# Patient Record
Sex: Female | Born: 1955 | Race: Black or African American | Hispanic: No | Marital: Single | Smoking: Current some day smoker
Health system: Southern US, Community
[De-identification: ages and names within clinical notes are randomized; demographics above are authoritative.]

## PROBLEM LIST (undated history)

## (undated) DIAGNOSIS — Z720 Tobacco use: Secondary | ICD-10-CM

## (undated) DIAGNOSIS — G4733 Obstructive sleep apnea (adult) (pediatric): Secondary | ICD-10-CM

## (undated) DIAGNOSIS — I10 Essential (primary) hypertension: Secondary | ICD-10-CM

## (undated) DIAGNOSIS — E669 Obesity, unspecified: Secondary | ICD-10-CM

## (undated) DIAGNOSIS — G4700 Insomnia, unspecified: Secondary | ICD-10-CM

## (undated) DIAGNOSIS — Q825 Congenital non-neoplastic nevus: Secondary | ICD-10-CM

## (undated) DIAGNOSIS — R011 Cardiac murmur, unspecified: Secondary | ICD-10-CM

## (undated) DIAGNOSIS — N6019 Diffuse cystic mastopathy of unspecified breast: Secondary | ICD-10-CM

## (undated) DIAGNOSIS — E7800 Pure hypercholesterolemia, unspecified: Secondary | ICD-10-CM

## (undated) HISTORY — DX: Congenital non-neoplastic nevus: Q82.5

## (undated) HISTORY — DX: Essential (primary) hypertension: I10

## (undated) HISTORY — DX: Diffuse cystic mastopathy of unspecified breast: N60.19

## (undated) HISTORY — DX: Obstructive sleep apnea (adult) (pediatric): G47.33

## (undated) HISTORY — DX: Tobacco use: Z72.0

## (undated) HISTORY — DX: Pure hypercholesterolemia, unspecified: E78.00

## (undated) HISTORY — DX: Insomnia, unspecified: G47.00

## (undated) HISTORY — DX: Obesity, unspecified: E66.9

## (undated) HISTORY — DX: Cardiac murmur, unspecified: R01.1

---

## 1982-11-18 HISTORY — PX: TUBAL LIGATION: SHX77

## 1998-07-18 ENCOUNTER — Encounter: Admission: RE | Admit: 1998-07-18 | Discharge: 1998-07-18 | Payer: Self-pay | Admitting: Internal Medicine

## 1998-08-15 ENCOUNTER — Encounter: Admission: RE | Admit: 1998-08-15 | Discharge: 1998-08-15 | Payer: Self-pay | Admitting: Internal Medicine

## 1999-02-21 ENCOUNTER — Encounter: Admission: RE | Admit: 1999-02-21 | Discharge: 1999-02-21 | Payer: Self-pay | Admitting: Hematology and Oncology

## 1999-12-03 ENCOUNTER — Encounter: Admission: RE | Admit: 1999-12-03 | Discharge: 1999-12-03 | Payer: Self-pay | Admitting: Internal Medicine

## 2000-03-17 ENCOUNTER — Encounter: Payer: Self-pay | Admitting: Emergency Medicine

## 2000-03-17 ENCOUNTER — Emergency Department (HOSPITAL_COMMUNITY): Admission: EM | Admit: 2000-03-17 | Discharge: 2000-03-17 | Payer: Self-pay | Admitting: *Deleted

## 2000-07-13 ENCOUNTER — Emergency Department (HOSPITAL_COMMUNITY): Admission: EM | Admit: 2000-07-13 | Discharge: 2000-07-13 | Payer: Self-pay | Admitting: Emergency Medicine

## 2000-11-05 ENCOUNTER — Encounter: Admission: RE | Admit: 2000-11-05 | Discharge: 2000-11-05 | Payer: Self-pay

## 2001-07-28 ENCOUNTER — Emergency Department (HOSPITAL_COMMUNITY): Admission: EM | Admit: 2001-07-28 | Discharge: 2001-07-28 | Payer: Self-pay | Admitting: Emergency Medicine

## 2001-12-09 ENCOUNTER — Encounter: Admission: RE | Admit: 2001-12-09 | Discharge: 2001-12-09 | Payer: Self-pay | Admitting: Internal Medicine

## 2001-12-28 ENCOUNTER — Ambulatory Visit (HOSPITAL_BASED_OUTPATIENT_CLINIC_OR_DEPARTMENT_OTHER): Admission: RE | Admit: 2001-12-28 | Discharge: 2001-12-28 | Payer: Self-pay | Admitting: *Deleted

## 2002-04-15 ENCOUNTER — Encounter: Admission: RE | Admit: 2002-04-15 | Discharge: 2002-04-15 | Payer: Self-pay | Admitting: Internal Medicine

## 2002-04-21 ENCOUNTER — Encounter: Admission: RE | Admit: 2002-04-21 | Discharge: 2002-04-21 | Payer: Self-pay | Admitting: Internal Medicine

## 2002-09-14 ENCOUNTER — Emergency Department (HOSPITAL_COMMUNITY): Admission: EM | Admit: 2002-09-14 | Discharge: 2002-09-14 | Payer: Self-pay | Admitting: Emergency Medicine

## 2002-09-17 ENCOUNTER — Emergency Department (HOSPITAL_COMMUNITY): Admission: EM | Admit: 2002-09-17 | Discharge: 2002-09-17 | Payer: Self-pay | Admitting: Emergency Medicine

## 2002-11-03 ENCOUNTER — Emergency Department (HOSPITAL_COMMUNITY): Admission: EM | Admit: 2002-11-03 | Discharge: 2002-11-03 | Payer: Self-pay | Admitting: Emergency Medicine

## 2003-02-14 ENCOUNTER — Encounter: Admission: RE | Admit: 2003-02-14 | Discharge: 2003-02-14 | Payer: Self-pay | Admitting: Internal Medicine

## 2003-05-25 ENCOUNTER — Emergency Department (HOSPITAL_COMMUNITY): Admission: EM | Admit: 2003-05-25 | Discharge: 2003-05-25 | Payer: Self-pay | Admitting: Emergency Medicine

## 2003-05-25 ENCOUNTER — Encounter: Payer: Self-pay | Admitting: Emergency Medicine

## 2003-05-30 ENCOUNTER — Emergency Department (HOSPITAL_COMMUNITY): Admission: EM | Admit: 2003-05-30 | Discharge: 2003-05-30 | Payer: Self-pay | Admitting: Emergency Medicine

## 2003-06-07 ENCOUNTER — Emergency Department (HOSPITAL_COMMUNITY): Admission: EM | Admit: 2003-06-07 | Discharge: 2003-06-07 | Payer: Self-pay | Admitting: Emergency Medicine

## 2003-06-10 ENCOUNTER — Emergency Department (HOSPITAL_COMMUNITY): Admission: EM | Admit: 2003-06-10 | Discharge: 2003-06-10 | Payer: Self-pay

## 2004-04-13 LAB — HM MAMMOGRAPHY: HM Mammogram: NORMAL

## 2004-04-18 ENCOUNTER — Encounter: Admission: RE | Admit: 2004-04-18 | Discharge: 2004-04-18 | Payer: Self-pay | Admitting: Internal Medicine

## 2004-05-14 ENCOUNTER — Encounter (INDEPENDENT_AMBULATORY_CARE_PROVIDER_SITE_OTHER): Payer: Self-pay | Admitting: *Deleted

## 2004-05-14 ENCOUNTER — Encounter: Admission: RE | Admit: 2004-05-14 | Discharge: 2004-05-14 | Payer: Self-pay | Admitting: Internal Medicine

## 2004-07-04 ENCOUNTER — Emergency Department (HOSPITAL_COMMUNITY): Admission: EM | Admit: 2004-07-04 | Discharge: 2004-07-04 | Payer: Self-pay | Admitting: Emergency Medicine

## 2004-08-10 ENCOUNTER — Emergency Department (HOSPITAL_COMMUNITY): Admission: EM | Admit: 2004-08-10 | Discharge: 2004-08-10 | Payer: Self-pay | Admitting: Emergency Medicine

## 2005-01-18 ENCOUNTER — Encounter (INDEPENDENT_AMBULATORY_CARE_PROVIDER_SITE_OTHER): Payer: Self-pay | Admitting: *Deleted

## 2005-01-18 LAB — CONVERTED CEMR LAB
Cholesterol: 207 mg/dL
HDL: 95 mg/dL
Triglycerides: 62 mg/dL

## 2005-02-18 ENCOUNTER — Ambulatory Visit: Payer: Self-pay | Admitting: Internal Medicine

## 2005-02-19 ENCOUNTER — Ambulatory Visit: Payer: Self-pay | Admitting: Internal Medicine

## 2006-04-02 ENCOUNTER — Ambulatory Visit: Payer: Self-pay | Admitting: Internal Medicine

## 2006-11-06 ENCOUNTER — Encounter (INDEPENDENT_AMBULATORY_CARE_PROVIDER_SITE_OTHER): Payer: Self-pay | Admitting: *Deleted

## 2006-11-06 DIAGNOSIS — G4730 Sleep apnea, unspecified: Secondary | ICD-10-CM | POA: Insufficient documentation

## 2006-11-06 DIAGNOSIS — G4700 Insomnia, unspecified: Secondary | ICD-10-CM | POA: Insufficient documentation

## 2006-11-06 DIAGNOSIS — R011 Cardiac murmur, unspecified: Secondary | ICD-10-CM

## 2006-11-06 DIAGNOSIS — F17200 Nicotine dependence, unspecified, uncomplicated: Secondary | ICD-10-CM | POA: Insufficient documentation

## 2006-11-06 DIAGNOSIS — N6019 Diffuse cystic mastopathy of unspecified breast: Secondary | ICD-10-CM

## 2006-11-06 DIAGNOSIS — E7800 Pure hypercholesterolemia, unspecified: Secondary | ICD-10-CM

## 2006-11-06 DIAGNOSIS — I10 Essential (primary) hypertension: Secondary | ICD-10-CM | POA: Insufficient documentation

## 2006-11-06 DIAGNOSIS — E669 Obesity, unspecified: Secondary | ICD-10-CM

## 2007-08-20 ENCOUNTER — Encounter (INDEPENDENT_AMBULATORY_CARE_PROVIDER_SITE_OTHER): Payer: Self-pay | Admitting: Infectious Diseases

## 2007-08-20 ENCOUNTER — Ambulatory Visit: Payer: Self-pay | Admitting: Internal Medicine

## 2007-08-20 LAB — CONVERTED CEMR LAB
BUN: 16 mg/dL (ref 6–23)
Calcium: 9.2 mg/dL (ref 8.4–10.5)
Cholesterol: 189 mg/dL (ref 0–200)
Glucose, Bld: 81 mg/dL (ref 70–99)
Triglycerides: 68 mg/dL (ref <150)

## 2008-09-27 ENCOUNTER — Ambulatory Visit: Payer: Self-pay | Admitting: Internal Medicine

## 2008-09-27 ENCOUNTER — Encounter (INDEPENDENT_AMBULATORY_CARE_PROVIDER_SITE_OTHER): Payer: Self-pay | Admitting: Internal Medicine

## 2008-09-27 DIAGNOSIS — G479 Sleep disorder, unspecified: Secondary | ICD-10-CM | POA: Insufficient documentation

## 2008-09-27 LAB — CONVERTED CEMR LAB
BUN: 11 mg/dL (ref 6–23)
Basophils Relative: 0 % (ref 0–1)
Calcium: 9.5 mg/dL (ref 8.4–10.5)
Chloride: 103 meq/L (ref 96–112)
Creatinine, Ser: 0.78 mg/dL (ref 0.40–1.20)
Eosinophils Absolute: 0.2 10*3/uL (ref 0.0–0.7)
Eosinophils Relative: 3 % (ref 0–5)
HCT: 38.2 % (ref 36.0–46.0)
Hemoglobin: 12.9 g/dL (ref 12.0–15.0)
MCHC: 33.8 g/dL (ref 30.0–36.0)
MCV: 90.7 fL (ref 78.0–100.0)
Monocytes Absolute: 0.4 10*3/uL (ref 0.1–1.0)
Monocytes Relative: 7 % (ref 3–12)
Neutro Abs: 2.5 10*3/uL (ref 1.7–7.7)
RBC: 4.21 M/uL (ref 3.87–5.11)
RDW: 13.5 % (ref 11.5–15.5)

## 2008-10-25 ENCOUNTER — Encounter: Payer: Self-pay | Admitting: Internal Medicine

## 2009-10-04 ENCOUNTER — Telehealth: Payer: Self-pay | Admitting: *Deleted

## 2009-10-19 ENCOUNTER — Telehealth: Payer: Self-pay | Admitting: *Deleted

## 2009-12-12 ENCOUNTER — Ambulatory Visit: Payer: Self-pay | Admitting: Internal Medicine

## 2009-12-12 LAB — CONVERTED CEMR LAB
AST: 24 units/L (ref 0–37)
Alkaline Phosphatase: 83 units/L (ref 39–117)
Glucose, Bld: 104 mg/dL — ABNORMAL HIGH (ref 70–99)
HDL: 76 mg/dL (ref >39)
LDL Cholesterol: 119 mg/dL — ABNORMAL HIGH (ref 0–99)
Sodium: 139 meq/L (ref 135–145)
Total Bilirubin: 0.6 mg/dL (ref 0.3–1.2)
Total Protein: 8.3 g/dL (ref 6.0–8.3)
Triglycerides: 68 mg/dL (ref <150)
VLDL: 14 mg/dL (ref 0–40)

## 2010-12-09 ENCOUNTER — Encounter: Payer: Self-pay | Admitting: Internal Medicine

## 2010-12-09 ENCOUNTER — Encounter: Payer: Self-pay | Admitting: Infectious Diseases

## 2010-12-18 NOTE — Assessment & Plan Note (Signed)
Summary: ACUTE-BAD COLD/CFB(SILWAL)   Vital Signs:  Patient profile:   55 year old female Height:      61 inches (154.94 cm) Weight:      200.1 pounds (90.95 kg) BMI:     37.95 Temp:     97.1 degrees F (36.17 degrees C) oral Pulse rate:   72 / minute BP sitting:   173 / 99  (right arm)  Vitals Entered By: Stanton Kidney Ditzler RN (December 12, 2009 8:52 AM) Is Patient Diabetic? No Pain Assessment Patient in pain? no      Nutritional Status BMI of > 30 = obese Nutritional Status Detail appetite good  Have you ever been in a relationship where you felt threatened, hurt or afraid?denies   Does patient need assistance? Functional Status Self care Ambulation Normal Comments Ck-up and refills on meds. Problems sleeping - ? stress of depression.   History of Present Illness: This is a 55 year old woman with past medical history outlined in chart.  She is here for a check up (does not have a cold as title of this document implies).    Her main complaint is that she is having trouble sleeping.  Sleeps for 3-4 hours at night.  Not sure why she wakes up, is not out of breath, does not snore, her mind is not racing, she is not uncomfortable.  Does not sleep during the day, but is always tired.  When she wakes up at night she eats, thinks this is stress eating she is not actually hungry.  Does not drink caffiene, does smoke about 1/2ppd, no ETOH.  Does not exercise.    Does have significant life stress.    Depression History:      The patient denies a depressed mood most of the day and a diminished interest in her usual daily activities.         Preventive Screening-Counseling & Management  Alcohol-Tobacco     Smoking Status: current     Smoking Cessation Counseling: yes     Packs/Day: 1/2     Year Started: AT THE AGE OF 18  Caffeine-Diet-Exercise     Does Patient Exercise: yes     Type of exercise: WALKING     Times/week: 5  Current Medications (verified): 1)  Hydrochlorothiazide 25  Mg Tabs (Hydrochlorothiazide) .... Take 1 Tablet By Mouth Once A Day 2)  Lisinopril 20 Mg Tabs (Lisinopril) .... Take 1 Tablet By Mouth Once A Day  Allergies (verified): No Known Drug Allergies  Social History: last 2 brothers and a fiance in 2009-2010 takes care of mohter and father who are both disabled.  lives with her mother. 1/2ppd smoker occasional ETOH no drug use.  Review of Systems       per hpi  Physical Exam  General:  alert and overweight-appearing.   Head:  normocephalic and atraumatic.   Eyes:  vision grossly intact, pupils equal, pupils round, and pupils reactive to light.   Mouth:  pharynx pink and moist and poor dentition.   Lungs:  normal respiratory effort and normal breath sounds.   Heart:  normal rate, regular rhythm, and Grade   3/6 systolic ejection murmur.   Extremities:  no edema Neurologic:  alert & oriented X3, cranial nerves II-XII intact, strength normal in all extremities, and gait normal.   Psych:  Oriented X3, memory intact for recent and remote, normally interactive, good eye contact, tearful, and slightly anxious.     Impression & Recommendations:  Problem #  1:  INSOMNIA (ICD-780.52) Is not taking abien that had previously been prescribed.  Does not want a prescription sleep aid.  Does not want medication for depression. Has had a sleep study in 2007, no CPAP recomended at that time.  Denies morning headache, snoring, night time gasping. Discussed sleep hygine, exercise, benadryl.  sleep hygeine hand out.  The following medications were removed from the medication list:    Ambien Cr 6.25 Mg Tbcr (Zolpidem tartrate) .Marland Kitchen... Take 1 tab by mouth at bedtime as needed  Problem # 2:  HYPERTENSION (ICD-401.9) takes meds with no missed doses.  BP stil way above goal, could be due to stress and insomnia, but will treat. will start norvasc 5 rtc in 2 weeks for bp check up.   Her updated medication list for this problem includes:     Hydrochlorothiazide 25 Mg Tabs (Hydrochlorothiazide) .Marland Kitchen... Take 1 tablet by mouth once a day    Lisinopril 20 Mg Tabs (Lisinopril) .Marland Kitchen... Take 1 tablet by mouth once a day    Amlodipine Besylate 5 Mg Tabs (Amlodipine besylate) .Marland Kitchen... Take one tablet daily for blood pressure.  Problem # 3:  HYPERCHOLESTEROLEMIA (ICD-272.0) lipids and cmet today. last checked in 2008.  Orders: T-Lipid Profile ([id number])  Problem # 4:  TOBACCO ABUSE (ICD-305.1) stil smoking 1/22pd.  trying to cut down. contemplation phase.  Problem # 5:  OBESITY (ICD-278.00) discussed diet and exercise.  will check a1c. insomnia contributes.  Orders: T-Hgb A1C (in-house) ([id number])  Complete Medication List: 1)  Hydrochlorothiazide 25 Mg Tabs (Hydrochlorothiazide) .... Take 1 tablet by mouth once a day 2)  Lisinopril 20 Mg Tabs (Lisinopril) .... Take 1 tablet by mouth once a day 3)  Amlodipine Besylate 5 Mg Tabs (Amlodipine besylate) .... Take one tablet daily for blood pressure.  Other Orders: T-Comprehensive Metabolic Panel ([id number]) T-Hemoccult Card-Multiple (take home) ([id number]) Mammogram (Screening) (Mammo)  Patient Instructions: 1)  You have a new medication for blood pressure. 2)  Please schedule a follow-up appointment in 2 weeks for blood pressure recheck. 3)  You have a handout on better sleep habits, use these suggestions to help you sleep.  Try to exercise daily this will help as well.  You can also try benadryl 12.5 mg at bedtime to help with sleep. 4)  You had lab work done today, we will call you if there is anything that needs to be addressed before your next appointment. Prescriptions: LISINOPRIL 20 MG TABS (LISINOPRIL) Take 1 tablet by mouth once a day  #31 x 6   Entered and Authorized by:   Elby Showers MD   Signed by:   Elby Showers MD on 12/12/2009   Method used:   Electronically to        [facility] (retail)       [address]       [place], [place]  [address]       Ph: [phone]       Fax: [phone]   RxID:   [id number] HYDROCHLOROTHIAZIDE 25 MG TABS (HYDROCHLOROTHIAZIDE) Take 1 tablet by mouth once a day  #31 x 6   Entered and Authorized by:   Elby Showers MD   Signed by:   Elby Showers MD on 12/12/2009   Method used:   Electronically to        [facility] (retail)       [address]  [place], [place]  [address]       Ph: [phone]       Fax: [phone]   RxID:   [id number] AMLODIPINE BESYLATE 5 MG TABS (AMLODIPINE BESYLATE) Take one tablet daily for blood pressure.  #32 x 3   Entered and Authorized by:   Elby Showers MD   Signed by:   Elby Showers MD on 12/12/2009   Method used:   Electronically to        [facility] (retail)       [address]       [place], [place]  [address]       Ph: [phone]       Fax: [phone]   RxID:   [id number]  Process Orders Check Orders Results:     [facility]: Order checked:     Elby Showers MD NOT AUTHORIZED TO ORDER Tests Sent for requisitioning (December 12, 2009 9:52 AM):     12/12/2009: [facility] -- T-Lipid Profile [id number] (signed)     12/12/2009: [facility] -- T-Comprehensive Metabolic Panel [phone] [phone] (signed)    Prevention & Chronic Care Immunizations   Influenza vaccine: Not documented    Tetanus booster: Not documented    Pneumococcal vaccine: Not documented  Colorectal Screening   Hemoccult: Not documented   Hemoccult action/deferral: Ordered  (12/12/2009)    Colonoscopy: Not documented  Other Screening   Pap smear: Not documented   Pap smear due: 12/12/2010    Mammogram: Normal  (04/13/2004)   Mammogram action/deferral: Ordered  (12/12/2009)   Smoking status: current  (12/12/2009)   Smoking cessation counseling: yes  (12/12/2009)  Lipids   Total Cholesterol: 189   (08/20/2007)   Lipid panel action/deferral: Lipid Panel ordered   LDL: 91  (08/20/2007)   LDL Direct: Not documented   HDL: 84  (08/20/2007)   Triglycerides: 68  (08/20/2007)    SGOT (AST): Not documented   BMP action: Ordered   SGPT (ALT): Not documented CMP ordered    Alkaline phosphatase: Not documented   Total bilirubin: Not documented    Lipid flowsheet reviewed?: Yes   Progress toward LDL goal: Unchanged  Hypertension   Last Blood Pressure: 173 / 99  (12/12/2009)   Serum creatinine: 0.78  (09/27/2008)   BMP action: Ordered   Serum potassium 3.8  (09/27/2008) CMP ordered     Hypertension flowsheet reviewed?: Yes   Progress toward BP goal: Unchanged  Self-Management Support :    Patient will work on the following items until the next clinic visit to reach self-care goals:     Medications and monitoring: take my medicines every day, check my blood pressure, weigh myself weekly  (12/12/2009)     Eating: eat more vegetables, use fresh or frozen vegetables, eat fruit for snacks and desserts  (12/12/2009)     Activity: take a 30 minute walk every day, take the stairs instead of the elevator, park at the far end of the parking lot  (12/12/2009)    Hypertension self-management support: Not documented    Lipid self-management support: Not documented    Nursing Instructions: Provide Hemoccult cards with instructions (see order) Schedule screening mammogram (see order)   Laboratory Results   Blood Tests   Date/Time Received: December 12, 2009 9:44 AM  Date/Time Reported: Burke Keels  December 12, 2009 9:44 AM   HGBA1C: 5.9%   (Normal  Range: Non-Diabetic - 3-6%   Control Diabetic - 6-8%)

## 2011-01-25 ENCOUNTER — Other Ambulatory Visit: Payer: Self-pay | Admitting: *Deleted

## 2011-01-25 MED ORDER — LISINOPRIL 20 MG PO TABS: 20.0000 mg | ORAL_TABLET | Freq: Every day | ORAL | Status: DC

## 2011-01-25 MED ORDER — HYDROCHLOROTHIAZIDE 25 MG PO TABS: 25.0000 mg | ORAL_TABLET | Freq: Every day | ORAL | Status: DC

## 2011-01-25 MED ORDER — AMLODIPINE BESYLATE 5 MG PO TABS: 5.0000 mg | ORAL_TABLET | Freq: Every day | ORAL | Status: DC

## 2011-01-25 NOTE — Telephone Encounter (Signed)
Pt requests enough refill to last until her appt 02/13/11 w/Dr Allena Katz.

## 2011-01-30 ENCOUNTER — Encounter: Payer: Self-pay | Admitting: Internal Medicine

## 2011-02-13 ENCOUNTER — Encounter: Payer: Self-pay | Admitting: Internal Medicine

## 2011-04-15 ENCOUNTER — Encounter: Payer: Self-pay | Admitting: Internal Medicine

## 2011-05-01 ENCOUNTER — Encounter: Payer: Self-pay | Admitting: Internal Medicine

## 2011-06-26 ENCOUNTER — Ambulatory Visit (INDEPENDENT_AMBULATORY_CARE_PROVIDER_SITE_OTHER): Payer: Self-pay | Admitting: Internal Medicine

## 2011-06-26 ENCOUNTER — Encounter: Payer: Self-pay | Admitting: Internal Medicine

## 2011-06-26 VITALS — BP 200/98 | HR 63 | Temp 97.3°F | Ht 61.0 in | Wt 198.6 lb

## 2011-06-26 DIAGNOSIS — I10 Essential (primary) hypertension: Secondary | ICD-10-CM

## 2011-06-26 MED ORDER — METOPROLOL TARTRATE 25 MG PO TABS: 25.0000 mg | ORAL_TABLET | Freq: Two times a day (BID) | ORAL | Status: DC

## 2011-06-26 MED ORDER — LISINOPRIL 40 MG PO TABS: 40.0000 mg | ORAL_TABLET | Freq: Every day | ORAL | Status: DC

## 2011-06-26 MED ORDER — HYDROCHLOROTHIAZIDE 25 MG PO TABS
25.0000 mg | ORAL_TABLET | Freq: Every day | ORAL | Status: DC
Start: 2011-06-26 — End: 2012-07-03

## 2011-06-26 NOTE — Progress Notes (Signed)
  Subjective:    Patient ID: Selena Mclean, female    DOB: [birth year], 55 y.o.   MRN: [id number]  HPI Selena Mclean is a pleasant 55 year old woman with past medical history of hypertension who comes in the clinic for regular followup visit. Her blood pressure today is 200/98. But she denies any symptoms namely chest pain, shortness of breath, headache, vision changes. She feels all right and at her baseline.  Denies any fever, chills, nausea, vomiting, abdominal pain, urinary abnormalities. She is pretty active and exercises (walks) almost every day.    Review of Systems    as per history of present illness, all other systems reviewed and negative. Objective:   Physical Exam  Constitutional: Vital signs reviewed.  Patient is a well-developed and well-nourished in no acute distress and cooperative with exam. Alert and oriented x3.  Head: Normocephalic and atraumatic Mouth: no erythema or exudates, MMM Eyes: PERRL, EOMI, conjunctivae normal, No scleral icterus.  Neck: Supple, Trachea midline normal ROM, No JVD, mass, thyromegaly, or carotid bruit present.  Cardiovascular: RRR, S1 normal, S2 normal, systolic murmur, pulses symmetric and intact bilaterally. Pulmonary/Chest: CTAB, no wheezes, rales, or rhonchi Abdominal: Soft. Non-tender, non-distended, bowel sounds are normal, no masses, organomegaly, or guarding present.  GU: no CVA tenderness Musculoskeletal: No joint deformities, erythema, or stiffness, ROM full and no nontender Neurological: A&O x3, Strenght is normal and symmetric bilaterally, cranial nerve II-XII are grossly intact, no focal motor deficit, sensory intact to light touch bilaterally.  Psychiatric: Normal mood and affect. speech and behavior is normal. Judgment and thought content normal. Cognition and memory are normal.         Assessment & Plan:

## 2011-06-26 NOTE — Patient Instructions (Signed)
Please make an follow up appointment in 5-6 weeks with Dr. Allena Katz. Please start Lisinopril at 40 mg daily instead of 20 mg daily. Also will add one new Blood pressure pill namely Metoprolol 25 mg twice a day. Please continue exercise.

## 2011-06-26 NOTE — Assessment & Plan Note (Signed)
Blood pressure today is 200/98. She says she took her blood pressure pills this morning but skips them once in a while. She did have her blood pressure checked at [facility] recently and it was high too. I will increase the dose of lisinopril to 40 mg daily and add metoprolol at 25 mg by mouth twice a day. I will continue HCTZ at 25 mg daily. Will check a BMP today. I will see her back in about 5-6 weeks to check her blood pressure and do appropriate changes in medications as needed.

## 2011-06-27 LAB — BASIC METABOLIC PANEL WITH GFR
BUN: 16 mg/dL (ref 6–23)
Chloride: 103 mEq/L (ref 96–112)
GFR, Est Non African American: >60 mL/min (ref >60)
Potassium: 3.6 mEq/L (ref 3.5–5.3)

## 2012-07-01 ENCOUNTER — Encounter: Payer: Self-pay | Admitting: Internal Medicine

## 2012-07-03 ENCOUNTER — Emergency Department (HOSPITAL_COMMUNITY)
Admission: EM | Admit: 2012-07-03 | Discharge: 2012-07-04 | Disposition: A | Discharge status: Home / Self Care | Payer: Self-pay | Attending: Emergency Medicine | Admitting: Emergency Medicine

## 2012-07-03 ENCOUNTER — Encounter (HOSPITAL_COMMUNITY): Payer: Self-pay | Admitting: Emergency Medicine

## 2012-07-03 DIAGNOSIS — F17200 Nicotine dependence, unspecified, uncomplicated: Secondary | ICD-10-CM | POA: Insufficient documentation

## 2012-07-03 DIAGNOSIS — E7800 Pure hypercholesterolemia, unspecified: Secondary | ICD-10-CM | POA: Insufficient documentation

## 2012-07-03 DIAGNOSIS — I10 Essential (primary) hypertension: Secondary | ICD-10-CM | POA: Insufficient documentation

## 2012-07-03 DIAGNOSIS — R51 Headache: Secondary | ICD-10-CM | POA: Insufficient documentation

## 2012-07-03 DIAGNOSIS — Z79899 Other long term (current) drug therapy: Secondary | ICD-10-CM | POA: Insufficient documentation

## 2012-07-03 NOTE — ED Notes (Signed)
GNF:[id number]<YQ> Expected date:07/03/12<BR> Expected time:10:07 PM<BR> Means of arrival:Ambulance<BR> Comments:<BR> RM: 6, PTAR 32, headache x 5 hr

## 2012-07-03 NOTE — ED Notes (Addendum)
Report given via PTAR. Pt c/o headache for 4-5 hrs. Pt is compliant with medications, but has been out of medications for 3 days. Pt takes lisinopril, HCTZ, and metoprololol. Initial VS at 2207 160/86 HR 92 RR 16 O2 RA 95%. Temporal headache 7/10.

## 2012-07-04 ENCOUNTER — Emergency Department (HOSPITAL_COMMUNITY)
Admission: EM | Admit: 2012-07-04 | Discharge: 2012-07-06 | Disposition: A | Discharge status: Home / Self Care | Payer: Self-pay | Attending: Emergency Medicine | Admitting: Emergency Medicine

## 2012-07-04 ENCOUNTER — Encounter (HOSPITAL_COMMUNITY): Payer: Self-pay | Admitting: Emergency Medicine

## 2012-07-04 DIAGNOSIS — G4733 Obstructive sleep apnea (adult) (pediatric): Secondary | ICD-10-CM | POA: Insufficient documentation

## 2012-07-04 DIAGNOSIS — F329 Major depressive disorder, single episode, unspecified: Secondary | ICD-10-CM | POA: Insufficient documentation

## 2012-07-04 DIAGNOSIS — E7800 Pure hypercholesterolemia, unspecified: Secondary | ICD-10-CM | POA: Insufficient documentation

## 2012-07-04 DIAGNOSIS — Z79899 Other long term (current) drug therapy: Secondary | ICD-10-CM | POA: Insufficient documentation

## 2012-07-04 DIAGNOSIS — F17200 Nicotine dependence, unspecified, uncomplicated: Secondary | ICD-10-CM | POA: Insufficient documentation

## 2012-07-04 DIAGNOSIS — I10 Essential (primary) hypertension: Secondary | ICD-10-CM | POA: Insufficient documentation

## 2012-07-04 DIAGNOSIS — F3289 Other specified depressive episodes: Secondary | ICD-10-CM | POA: Insufficient documentation

## 2012-07-04 LAB — CBC WITH DIFFERENTIAL/PLATELET
Basophils Absolute: 0 10*3/uL (ref 0.0–0.1)
Basophils Relative: 0 % (ref 0–1)
MCHC: 34.6 g/dL (ref 30.0–36.0)
Monocytes Absolute: 0.7 10*3/uL (ref 0.1–1.0)
Neutro Abs: 6.9 10*3/uL (ref 1.7–7.7)
Neutrophils Relative %: 62 % (ref 43–77)
Platelets: 302 10*3/uL (ref 150–400)
RDW: 13.4 % (ref 11.5–15.5)

## 2012-07-04 LAB — ACETAMINOPHEN LEVEL: Acetaminophen (Tylenol), Serum: <15 ug/mL (ref 10–30)

## 2012-07-04 LAB — COMPREHENSIVE METABOLIC PANEL
AST: 31 U/L (ref 0–37)
Albumin: 3.9 g/dL (ref 3.5–5.2)
Chloride: 97 mEq/L (ref 96–112)
Creatinine, Ser: 0.91 mg/dL (ref 0.50–1.10)
Potassium: 4.2 mEq/L (ref 3.5–5.1)
Sodium: 133 mEq/L — ABNORMAL LOW (ref 135–145)
Total Bilirubin: 0.4 mg/dL (ref 0.3–1.2)

## 2012-07-04 MED ORDER — ACETAMINOPHEN 325 MG PO TABS: 650.0000 mg | ORAL_TABLET | ORAL | Status: DC | PRN

## 2012-07-04 MED ORDER — LISINOPRIL 40 MG PO TABS: 40.0000 mg | ORAL_TABLET | Freq: Every day | ORAL | Status: DC

## 2012-07-04 MED ORDER — NICOTINE 21 MG/24HR TD PT24
21.0000 mg | MEDICATED_PATCH | Freq: Every day | TRANSDERMAL | Status: DC
  Filled 2012-07-04: qty 1

## 2012-07-04 MED ORDER — ALUM & MAG HYDROXIDE-SIMETH 200-200-20 MG/5ML PO SUSP: 30.0000 mL | ORAL | Status: DC | PRN

## 2012-07-04 MED ORDER — METOPROLOL TARTRATE 25 MG PO TABS: 25.0000 mg | ORAL_TABLET | Freq: Two times a day (BID) | ORAL | Status: DC

## 2012-07-04 MED ORDER — ZOLPIDEM TARTRATE 10 MG PO TABS
10.0000 mg | ORAL_TABLET | Freq: Every evening | ORAL | Status: DC | PRN
  Filled 2012-07-04: qty 1

## 2012-07-04 MED ORDER — LISINOPRIL 40 MG PO TABS
40.0000 mg | ORAL_TABLET | Freq: Every day | ORAL | Status: DC
  Administered 2012-07-04 – 2012-07-05 (×2): 40 mg via ORAL
  Filled 2012-07-04 (×3): qty 1

## 2012-07-04 MED ORDER — ONDANSETRON HCL 4 MG PO TABS: 4.0000 mg | ORAL_TABLET | Freq: Three times a day (TID) | ORAL | Status: DC | PRN

## 2012-07-04 MED ORDER — LORAZEPAM 1 MG PO TABS
1.0000 mg | ORAL_TABLET | Freq: Three times a day (TID) | ORAL | Status: DC | PRN
  Filled 2012-07-04: qty 1

## 2012-07-04 MED ORDER — IBUPROFEN 600 MG PO TABS
600.0000 mg | ORAL_TABLET | Freq: Three times a day (TID) | ORAL | Status: DC | PRN
  Filled 2012-07-04: qty 1

## 2012-07-04 MED ORDER — HYDROCHLOROTHIAZIDE 25 MG PO TABS
25.0000 mg | ORAL_TABLET | Freq: Every day | ORAL | Status: DC
  Administered 2012-07-04 – 2012-07-05 (×2): 25 mg via ORAL
  Filled 2012-07-04 (×3): qty 1

## 2012-07-04 MED ORDER — TRAZODONE HCL 100 MG PO TABS
100.0000 mg | ORAL_TABLET | Freq: Every day | ORAL | Status: DC
  Administered 2012-07-05: 100 mg via ORAL
  Filled 2012-07-04 (×2): qty 1

## 2012-07-04 MED ORDER — HYDROCHLOROTHIAZIDE 25 MG PO TABS: 25.0000 mg | ORAL_TABLET | Freq: Every day | ORAL | Status: DC

## 2012-07-04 MED ORDER — METOPROLOL TARTRATE 25 MG PO TABS
25.0000 mg | ORAL_TABLET | Freq: Two times a day (BID) | ORAL | Status: DC
  Administered 2012-07-04 – 2012-07-05 (×4): 25 mg via ORAL
  Filled 2012-07-04 (×4): qty 1

## 2012-07-04 MED ORDER — KETOROLAC TROMETHAMINE 60 MG/2ML IM SOLN
60.0000 mg | Freq: Once | INTRAMUSCULAR | Status: AC
  Administered 2012-07-04: 60 mg via INTRAMUSCULAR
  Filled 2012-07-04: qty 2

## 2012-07-04 MED ORDER — CITALOPRAM HYDROBROMIDE 40 MG PO TABS
40.0000 mg | ORAL_TABLET | Freq: Every day | ORAL | Status: DC
  Administered 2012-07-05 (×2): 40 mg via ORAL
  Filled 2012-07-04 (×4): qty 1

## 2012-07-04 NOTE — ED Notes (Signed)
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 Pt said she lived with her mom all her life. Pt tearful at times. When asked about suicidality pt says "I think if I wasn't here it would be a lot easier".

## 2012-07-04 NOTE — BHH Counselor (Signed)
Will wait to assess after Tele Psych since there may be a chance pt is discharged with follow up.  However, if pt needs placement, ACT will assess and find placement appropriate for pt.  Currently no beds available.

## 2012-07-04 NOTE — ED Notes (Signed)
Pt skin warm and dry. Patient in NAD. Respirations even, unlabored, even chest rise and fall. Will assess patient and medicate with due medications upon waking.

## 2012-07-04 NOTE — ED Provider Notes (Signed)
History     CSN: [id number]  Arrival date & time 07/04/12  1222   First MD Initiated Contact with Patient 07/04/12 1247      Chief Complaint  Patient presents with  . V70.1    (Consider location/radiation/quality/duration/timing/severity/associated sxs/prior treatment) HPI  About a year and a half ago she lost her brother, a friend, and her father with in about 6 months of each other. 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 She states she's never lived on her own. Her mother died this week and her service was yesterday. Patient states she has absolutely no family, she has no husband or children. She states she doesn't know what to do, states "I need some help". She states she has been treated for depression a year and a half ago when she had been 3 deaths close together and was treated with Celexa and trazodone however she started feeling better and she quit taking them.She reports her mother started getting in poor health in April and she started going back to [facility] for depression and she was restarted on her Celexa and trazodone in July. However she relates she still is severely depressed. She states "if I wasn't here I wouldn't have to deal with all of his" when asked if she had thoughts about hurting herself, then starts crying.   Pt seen in the ED yesterday for headaches and being out of her BP medications. Did relate she was depressed at that time.   PCP [facility]  Past Medical History  Diagnosis Date  . Hypertension   . Birthmarks, pigmented     right arm since birth  . Obesity   . Hypercholesterolemia     hypercholesterolemia- diet [place]  . Fibrocystic breast   . Tobacco abuse     1/2 packper day  . Obstructive sleep apnea     mild, no ask recommeded  . Systolic murmur     innocent per pt., evaluateed years ago  . Insomnia     Past Surgical History  Procedure Date  . Tubal ligation 1984    No family  history on file.  History  Substance Use Topics  . Smoking status: Current Some Day Smoker -- 0.5 packs/day    Types: Cigarettes  . Smokeless tobacco: Not on file  . Alcohol Use: Yes  unemployed Lives alone  OB History    Grav Para Term Preterm Abortions TAB SAB Ect Mult Living                  Review of Systems  All other systems reviewed and are negative.    Allergies  Review of patient's allergies indicates no known allergies.  Home Medications   Current Outpatient Rx  Name Route Sig Dispense Refill  . HYDROCHLOROTHIAZIDE 25 MG PO TABS Oral Take 25 mg by mouth daily.    Marland Kitchen LISINOPRIL 40 MG PO TABS Oral Take 40 mg by mouth daily.    Marland Kitchen METOPROLOL TARTRATE 25 MG PO TABS Oral Take 25 mg by mouth 2 (two) times daily.      BP 156/82  Pulse 85  Temp 98.7 F (37.1 C) (Oral)  Resp 18  SpO2 97%  Vital signs normal except hypertension   Physical Exam  Nursing note and vitals reviewed. Constitutional: She is oriented to person, place, and time. She appears well-developed and well-nourished.  Non-toxic appearance. She does not appear ill. No distress.  HENT:  Head: Normocephalic and atraumatic.  Right Ear: External ear normal.  Left Ear: External ear normal.  Nose: Nose normal. No mucosal edema or rhinorrhea.  Mouth/Throat: Oropharynx is clear and moist and mucous membranes are normal. No dental abscesses or uvula swelling.  Eyes: Conjunctivae and EOM are normal. Pupils are equal, round, and reactive to light.  Neck: Normal range of motion and full passive range of motion without pain. Neck supple.  Cardiovascular: Normal rate, regular rhythm and normal heart sounds.  Exam reveals no gallop and no friction rub.   No murmur heard. Pulmonary/Chest: Effort normal and breath sounds normal. No respiratory distress. She has no wheezes. She has no rhonchi. She has no rales. She exhibits no tenderness and no crepitus.  Abdominal: Soft. Normal appearance and bowel sounds are  normal. She exhibits no distension. There is no tenderness. There is no rebound and no guarding.  Musculoskeletal: Normal range of motion. She exhibits no edema and no tenderness.       Moves all extremities well.   Neurological: She is alert and oriented to person, place, and time. She has normal strength. No cranial nerve deficit.  Skin: Skin is warm, dry and intact. No rash noted. No erythema. No pallor.  Psychiatric: Her speech is normal and behavior is normal. Her mood appears not anxious.       Tearful, flat affect    ED Course  Procedures (including critical care time)  14:15 Bobby, ACT will evaluate patient.   16:00 Paperwork for [employer] signed.   Results for orders placed during the hospital encounter of 07/04/12  CBC WITH DIFFERENTIAL      Component Value Range   WBC 11.2 (*) 4.0 - 10.5 K/uL   RBC 4.71  3.87 - 5.11 MIL/uL   Hemoglobin 14.3  12.0 - 15.0 g/dL   HCT 78.4  69.6 - 29.5 %   MCV 87.7  78.0 - 100.0 fL   MCH 30.4  26.0 - 34.0 pg   MCHC 34.6  30.0 - 36.0 g/dL   RDW 28.4  13.2 - 44.0 %   Platelets 302  150 - 400 K/uL   Neutrophils Relative 62  43 - 77 %   Neutro Abs 6.9  1.7 - 7.7 K/uL   Lymphocytes Relative 31  12 - 46 %   Lymphs Abs 3.4  0.7 - 4.0 K/uL   Monocytes Relative 7  3 - 12 %   Monocytes Absolute 0.7  0.1 - 1.0 K/uL   Eosinophils Relative 1  0 - 5 %   Eosinophils Absolute 0.1  0.0 - 0.7 K/uL   Basophils Relative 0  0 - 1 %   Basophils Absolute 0.0  0.0 - 0.1 K/uL  COMPREHENSIVE METABOLIC PANEL      Component Value Range   Sodium 133 (*) 135 - 145 mEq/L   Potassium 4.2  3.5 - 5.1 mEq/L   Chloride 97  96 - 112 mEq/L   CO2 29  19 - 32 mEq/L   Glucose, Bld 106 (*) 70 - 99 mg/dL   BUN 22  6 - 23 mg/dL   Creatinine, Ser 1.02  0.50 - 1.10 mg/dL   Calcium 9.7  8.4 - 72.5 mg/dL   Total Protein 7.8  6.0 - 8.3 g/dL   Albumin 3.9  3.5 - 5.2 g/dL   AST 31  0 - 37 U/L   ALT 26  0 - 35 U/L   Alkaline Phosphatase 95  39 - 117 U/L  Total Bilirubin 0.4   0.3 - 1.2 mg/dL   GFR calc non Af Amer 70 (*) >90 mL/min   GFR calc Af Amer 81 (*) >90 mL/min  ETHANOL      Component Value Range   Alcohol, Ethyl (B) <11  0 - 11 mg/dL  ACETAMINOPHEN LEVEL      Component Value Range   Acetaminophen (Tylenol), Serum <15.0  10 - 30 ug/mL   Laboratory interpretation all normal except mild hyponatremia       1. Depression    Disposition per ACT  Devoria Albe, MD, FACEP    MDM          Ward Givens, MD 07/04/12 [phone]

## 2012-07-04 NOTE — ED Notes (Signed)
telepsych information called and faxed over to [facility]

## 2012-07-04 NOTE — ED Provider Notes (Signed)
History     CSN: [id number]  Arrival date & time 07/03/12  2225   First MD Initiated Contact with Patient 07/04/12 [phone]      Chief Complaint  Patient presents with  . Headache   HPI  History provided by the patient. Patient is a 56 year old female with history of hypertension, hypercholesterolemia who presents with complaints of persistent episodic waxing and waning headaches. She reports having headaches over the past several days primarily located to the right temporal area and for head area bilaterally. Pain is a persistent aching pain. She denies any associated photophobia, nausea or vomiting. This evening headache has been persistent for the past 5 hours. Patient does admit to some sadness and crying due to her mother's passing. Patient also mentions that she is out of her blood pressure medications. She normally takes lisinopril, metoprolol, hydrochlorothiazide. She denies any chest pain, chest pressure, heart palpitations or shortness of breath.   Past Medical History  Diagnosis Date  . Hypertension   . Birthmarks, pigmented     right arm since birth  . Obesity   . Hypercholesterolemia     hypercholesterolemia- diet [place]  . Fibrocystic breast   . Tobacco abuse     1/2 packper day  . Obstructive sleep apnea     mild, no ask recommeded  . Systolic murmur     innocent per pt., evaluateed years ago  . Insomnia     Past Surgical History  Procedure Date  . Tubal ligation 1984    No family history on file.  History  Substance Use Topics  . Smoking status: Current Some Day Smoker -- 0.5 packs/day    Types: Cigarettes  . Smokeless tobacco: Not on file  . Alcohol Use: Not on file    OB History    Grav Para Term Preterm Abortions TAB SAB Ect Mult Living                  Review of Systems  Constitutional: Negative for fever and chills.  HENT: Negative for neck pain.   Gastrointestinal: Negative for nausea, vomiting and abdominal pain.  Neurological: Positive for  headaches. Negative for dizziness and light-headedness.    Allergies  Review of patient's allergies indicates no known allergies.  Home Medications   Current Outpatient Rx  Name Route Sig Dispense Refill  . HYDROCHLOROTHIAZIDE 25 MG PO TABS Oral Take 25 mg by mouth daily.    Marland Kitchen LISINOPRIL 40 MG PO TABS Oral Take 40 mg by mouth daily.    Marland Kitchen METOPROLOL TARTRATE 25 MG PO TABS Oral Take 25 mg by mouth 2 (two) times daily.      BP 137/67  Pulse 71  Temp 98 F (36.7 C) (Oral)  Resp 18  SpO2 96%  Physical Exam  Nursing note and vitals reviewed. Constitutional: She is oriented to person, place, and time. She appears well-developed and well-nourished. No distress.  HENT:  Head: Normocephalic and atraumatic.  Nose: Nose normal. Right sinus exhibits no maxillary sinus tenderness and no frontal sinus tenderness. Left sinus exhibits no maxillary sinus tenderness and no frontal sinus tenderness.  Eyes: Conjunctivae and EOM are normal. Pupils are equal, round, and reactive to light.  Neck: Normal range of motion. Neck supple.       No meningeal signs  Cardiovascular: Normal rate and regular rhythm.   Pulmonary/Chest: Effort normal and breath sounds normal.  Neurological: She is alert and oriented to person, place, and time. She has normal strength. No cranial  nerve deficit or sensory deficit. Gait normal.  Skin: Skin is warm and dry. No rash noted.  Psychiatric: She has a normal mood and affect. Her behavior is normal.    ED Course  Procedures     1. Headache       MDM  Patient seen and evaluated. Patient in no acute distress. Patient with normal nonfocal neuro exam. Patient does admit to being under increased stress due to mother's recent passing.        Angus Seller, [place] 07/04/12 [phone]

## 2012-07-04 NOTE — ED Notes (Signed)
MD at bedside. 

## 2012-07-04 NOTE — ED Notes (Signed)
Mother passed away and was buried yesterday. 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 She has lost her father and both brothers so she is alone. Single and w/o children. Needs help, doesn't know what she needs to do.

## 2012-07-04 NOTE — ED Notes (Signed)
telepsych completed. Pt going to the bathroom and will give urine sample as well.

## 2012-07-04 NOTE — ED Notes (Signed)
telepsych in progress 

## 2012-07-05 LAB — URINALYSIS, ROUTINE W REFLEX MICROSCOPIC
Bilirubin Urine: NEGATIVE
Nitrite: NEGATIVE
Specific Gravity, Urine: 1.025 (ref 1.005–1.030)
pH: 6 (ref 5.0–8.0)

## 2012-07-05 LAB — RAPID URINE DRUG SCREEN, HOSP PERFORMED
Benzodiazepines: NOT DETECTED
Cocaine: POSITIVE — AB

## 2012-07-05 NOTE — ED Notes (Addendum)
Spoke to EDP about pt's blood pressure being elevated and her pulse trending down. Pt does have history of HTN and reports not taking her BP meds prior to admission because she was out of them. EDP will review VS and medications for this pt.

## 2012-07-05 NOTE — ED Notes (Signed)
Up to shower

## 2012-07-05 NOTE — BH Assessment (Signed)
Assessment Note   Selena Mclean is an 56 y.o. female. PT PRESENTS WITH INCREASE DEPRESSION & GRIEVING OVER THE LOST OF MOTHER LAST WEEK. PT STATES SHE HAS LIVING WITH MOM ALL HER LIFE & NOW THAT SHE IS GONE DOES NOT KNOW WHAT TO DO. PT EXPRESSED THAT SHE HAS NO INCOME NOR IS SHE WORKING. PT DENIES ANY IDEATION NOR HALLUCINATION. PT SAYS SHE WANTED TO BE ADMITTED SO SHE COULD BE AROUND OTHER PEOPLE. PT WAS INFORMED SHE DID NOT MEET CRITERIA & PT WERE NOT HOSPITALIZED SO THEY COULD BE KEPT COMPANY. NURSE & EDP WERE INFORMED OF CLINICIANS RECOMMENDATION TO BE DISCHARGED & FOLLOW UP WITH PROVIDER. CLINICIAN ALSO RECOMMENDED A TELEPSYCH IF A CLEAR DISPOSITION WAS NEEDED 7 PT COULD ALSO BE REFERRED TO SOCIAL WORK FOR PLACEMENT IF NEEDED.  Axis I: Depressive Disorder NOS Axis II: Deferred Axis III:  Past Medical History  Diagnosis Date  . Hypertension   . Birthmarks, pigmented     right arm since birth  . Obesity   . Hypercholesterolemia     hypercholesterolemia- diet [place]  . Fibrocystic breast   . Tobacco abuse     1/2 packper day  . Obstructive sleep apnea     mild, no ask recommeded  . Systolic murmur     innocent per pt., evaluateed years ago  . Insomnia    Axis IV: problems with primary support group Axis V: 51-60 moderate symptoms  Past Medical History:  Past Medical History  Diagnosis Date  . Hypertension   . Birthmarks, pigmented     right arm since birth  . Obesity   . Hypercholesterolemia     hypercholesterolemia- diet [place]  . Fibrocystic breast   . Tobacco abuse     1/2 packper day  . Obstructive sleep apnea     mild, no ask recommeded  . Systolic murmur     innocent per pt., evaluateed years ago  . Insomnia     Past Surgical History  Procedure Date  . Tubal ligation 1984    Family History: No family history on file.  Social History:  reports that she has been smoking Cigarettes.  She has been smoking about .5 packs per day. She does not have any smokeless  tobacco history on file. She reports that she drinks alcohol. She reports that she does not use illicit drugs.  Additional Social History:     CIWA: CIWA-Ar BP: 147/86 mmHg Pulse Rate: 60  COWS:    Allergies: No Known Allergies  Home Medications:  (Not in a hospital admission)  OB/GYN Status:  No LMP recorded. Patient is postmenopausal.  General Assessment Data Location of Assessment: [facility] ED ACT Assessment: Yes Living Arrangements: Alone Can pt return to current living arrangement?: Yes Admission Status: Voluntary Is patient capable of signing voluntary admission?: Yes Transfer from: Acute Hospital Referral Source: Self/Family/Friend     Risk to self Suicidal Ideation: No Suicidal Intent: No Is patient at risk for suicide?: No Suicidal Plan?: No Access to Means: No What has been your use of drugs/alcohol within the last 12 months?: NA Previous Attempts/Gestures: No How many times?: 0  Other Self Harm Risks: NA Triggers for Past Attempts: Unpredictable Intentional Self Injurious Behavior: None Family Suicide History: No Recent stressful life event(s): Turmoil (Comment) Persecutory voices/beliefs?: No Depression: Yes Depression Symptoms: Loss of interest in usual pleasures;Feeling worthless/self pity Substance abuse history and/or treatment for substance abuse?: No Suicide prevention information given to non-admitted patients: Not applicable  Risk to  Others Homicidal Ideation: No Thoughts of Harm to Others: No Current Homicidal Intent: No Current Homicidal Plan: No Access to Homicidal Means: No Identified Victim: NA History of harm to others?: No Assessment of Violence: None Noted Violent Behavior Description: CALM, COOPERATIVE Does patient have access to weapons?: No Criminal Charges Pending?: No Does patient have a court date: No  Psychosis Hallucinations: None noted Delusions: None noted  Mental Status Report Appear/Hygiene: Improved Eye Contact:  Fair Motor Activity: Freedom of movement Speech: Logical/coherent Level of Consciousness: Alert Mood: Depressed Affect: Appropriate to circumstance;Depressed Anxiety Level: None Thought Processes: Coherent;Relevant Judgement: Unimpaired Orientation: Person;Place;Time;Situation Obsessive Compulsive Thoughts/Behaviors: None  Cognitive Functioning Concentration: Decreased Memory: Recent Intact;Remote Intact IQ: Average Insight: Fair Impulse Control: Fair Appetite: Good Weight Loss: 0  Weight Gain: 0  Sleep: No Change Total Hours of Sleep: 8  Vegetative Symptoms: None  ADLScreening [facility]) Patient's cognitive ability adequate to safely complete daily activities?: Yes Patient able to express need for assistance with ADLs?: Yes Independently performs ADLs?: Yes (appropriate for developmental age)  Abuse/Neglect [facility]) Physical Abuse: Denies Verbal Abuse: Denies Sexual Abuse: Denies  Prior Inpatient Therapy Prior Inpatient Therapy: No Prior Therapy Dates: NA Prior Therapy Facilty/Provider(s): NA Reason for Treatment: NA  Prior Outpatient Therapy Prior Outpatient Therapy: Yes Prior Therapy Dates: CURRENTLY Prior Therapy Facilty/Provider(s): [facility] Reason for Treatment: THERAPY & MED AMANGEMENT  ADL Screening (condition at time of admission) Patient's cognitive ability adequate to safely complete daily activities?: Yes Patient able to express need for assistance with ADLs?: Yes Independently performs ADLs?: Yes (appropriate for developmental age)       Abuse/Neglect Assessment (Assessment to be complete while patient is alone) Physical Abuse: Denies Verbal Abuse: Denies Sexual Abuse: Denies Values / Beliefs Cultural Requests During Hospitalization: None Spiritual Requests During Hospitalization: None        Additional Information 1:1 In Past 12 Months?: No CIRT Risk: No Elopement Risk: No Does patient have medical clearance?:  Yes     Disposition:  Disposition Disposition of Patient: Outpatient treatment Type of outpatient treatment: Adult  On Site Evaluation by:   Reviewed with Physician:     Waldron Session 07/05/2012 2:20 AM

## 2012-07-05 NOTE — ED Notes (Signed)
Pt up to bathroom.

## 2012-07-05 NOTE — ED Notes (Signed)
Up to bathroom

## 2012-07-05 NOTE — ED Notes (Signed)
Pt asked for items to take a shower.

## 2012-07-05 NOTE — ED Notes (Signed)
Encouraged pt to increase fluid intake

## 2012-07-05 NOTE — ED Notes (Signed)
EDP called back and stated BP medications had been started and she wasn't going to make any changes. Explained to EDP we are required to notify EDP of high blood pressures and low pulse rates. EDP explained that beta blockers lower the pulse rate.

## 2012-07-05 NOTE — ED Notes (Signed)
Pt up to nursing station to receive telephone call.

## 2012-07-05 NOTE — ED Notes (Signed)
Up using the phone 

## 2012-07-06 NOTE — BHH Counselor (Signed)
Pt denies SI and HI and denies psychotic symptoms. Writer told pt that she will be d/c as she doesn't meet inpt criteria. Pt states she will make appt with her current outpatient provider. Pt asks to use phone to make arrangements to get house key from neighbor.

## 2012-07-06 NOTE — ED Notes (Signed)
Notified EDP of pt's D/C v/s: 187/79, HR 69,  EDP also informed that pt. Is taking home medications for B/P and requested that she be told to take them this morning.  Pt. Informed.

## 2012-07-07 ENCOUNTER — Other Ambulatory Visit: Payer: Self-pay | Admitting: *Deleted

## 2012-07-07 NOTE — Telephone Encounter (Signed)
Pt is out of meds

## 2012-07-08 ENCOUNTER — Encounter: Payer: Self-pay | Admitting: Internal Medicine

## 2012-07-08 MED ORDER — LISINOPRIL 40 MG PO TABS: 40.0000 mg | ORAL_TABLET | Freq: Every day | ORAL | Status: DC

## 2012-07-08 MED ORDER — METOPROLOL TARTRATE 25 MG PO TABS: 25.0000 mg | ORAL_TABLET | Freq: Two times a day (BID) | ORAL | Status: DC

## 2012-07-08 MED ORDER — HYDROCHLOROTHIAZIDE 25 MG PO TABS: 25.0000 mg | ORAL_TABLET | Freq: Every day | ORAL | Status: DC

## 2013-02-26 ENCOUNTER — Emergency Department (INDEPENDENT_AMBULATORY_CARE_PROVIDER_SITE_OTHER)
Admission: EM | Admit: 2013-02-26 | Discharge: 2013-02-26 | Disposition: A | Discharge status: Home / Self Care | Payer: No Typology Code available for payment source | Source: Home / Self Care | Attending: Emergency Medicine | Admitting: Emergency Medicine

## 2013-02-26 ENCOUNTER — Encounter (HOSPITAL_COMMUNITY): Payer: Self-pay | Admitting: *Deleted

## 2013-02-26 DIAGNOSIS — M7072 Other bursitis of hip, left hip: Secondary | ICD-10-CM

## 2013-02-26 DIAGNOSIS — M76899 Other specified enthesopathies of unspecified lower limb, excluding foot: Secondary | ICD-10-CM

## 2013-02-26 MED ORDER — DICLOFENAC SODIUM 75 MG PO TBEC: 75.0000 mg | DELAYED_RELEASE_TABLET | Freq: Two times a day (BID) | ORAL | Status: DC

## 2013-02-26 NOTE — ED Provider Notes (Signed)
History     CSN: [id number]  Arrival date & time 02/26/13  1003   First MD Initiated Contact with Patient 02/26/13 1017      Chief Complaint  Patient presents with  . Leg Pain    (Consider location/radiation/quality/duration/timing/severity/associated sxs/prior treatment) HPI Comments: Patient presents urgent care describing pain to the lateral and anterior aspect of her left hip. Pain is exacerbated with movement and standing for long periods of time. 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 In the last few weeks have been having more pain lateral aspect of her head. She denies any falls or injuries, denies any constitutional symptoms such as fevers, unintentional weight loss. denies any paresthesias, no weakness,or changes in bowel or urinary patterns.  Patient is a 57 y.o. female presenting with leg pain. The history is provided by the patient.  Leg Pain Location:  Hip and leg Hip location:  R hip Pain details:    Quality:  Aching   Severity:  Moderate   Onset quality:  Gradual   Timing:  Constant   Progression:  Waxing and waning Prior injury to area:  No Relieved by:  Rest Worsened by:  Activity and bearing weight (And a long periods of time) Ineffective treatments:  Arthritis medication Associated symptoms: no back pain, no decreased ROM, no fatigue, no fever, no muscle weakness, no numbness, no swelling and no tingling   Risk factors: no frequent fractures     Past Medical History  Diagnosis Date  . Hypertension   . Birthmarks, pigmented     right arm since birth  . Obesity   . Hypercholesterolemia     hypercholesterolemia- diet [place]  . Fibrocystic breast   . Tobacco abuse     1/2 packper day  . Obstructive sleep apnea     mild, no ask recommeded  . Systolic murmur     innocent per pt., evaluateed years ago  . Insomnia     Past Surgical History  Procedure Laterality Date  . Tubal ligation  1984    History reviewed. No pertinent  family history.  History  Substance Use Topics  . Smoking status: Current Some Day Smoker -- 0.50 packs/day    Types: Cigarettes  . Smokeless tobacco: Not on file  . Alcohol Use: Yes    OB History   Grav Para Term Preterm Abortions TAB SAB Ect Mult Living                  Review of Systems  Constitutional: Positive for activity change. Negative for fever, chills, appetite change and fatigue.  Cardiovascular: Negative for chest pain and leg swelling.  Musculoskeletal: Positive for arthralgias. Negative for myalgias and back pain.  Skin: Negative for color change, pallor, rash and wound.  Neurological: Negative for dizziness, weakness and numbness.    Allergies  Review of patient's allergies indicates no known allergies.  Home Medications   Current Outpatient Rx  Name  Route  Sig  Dispense  Refill  . diclofenac (VOLTAREN) 75 MG EC tablet   Oral   Take 1 tablet (75 mg total) by mouth 2 (two) times daily.   14 tablet   0   . hydrochlorothiazide (HYDRODIURIL) 25 MG tablet   Oral   Take 1 tablet (25 mg total) by mouth daily.   30 tablet   5   . lisinopril (PRINIVIL,ZESTRIL) 40 MG tablet   Oral   Take 1 tablet (40 mg total)  by mouth daily.   30 tablet   5   . metoprolol tartrate (LOPRESSOR) 25 MG tablet   Oral   Take 1 tablet (25 mg total) by mouth 2 (two) times daily.   60 tablet   5     BP 120/80  Pulse 72  Temp(Src) 98.6 F (37 C) (Oral)  Resp 16  SpO2 100%  Physical Exam  Vitals reviewed. Constitutional: Vital signs are normal. She appears well-developed.  Non-toxic appearance. She does not have a sickly appearance. She does not appear ill. No distress.  Musculoskeletal: She exhibits tenderness.       Legs: Neurological: She is alert.  Skin: No rash noted. No erythema.    ED Course  Procedures (including critical care time)  Labs Reviewed - No data to display No results found.   1. Hip bursitis, left       MDM  Left hip bursitis.  Patient has been prescribed a course of anti-inflammatory medicine for the next 14 days encouraged to talk or orthopedic Dr. if improvement is not noted after 10 days.        Jimmie Molly, MD 02/26/13 1233

## 2013-02-26 NOTE — ED Notes (Signed)
Pt  Reports    Pain  r      Leg  For  What  Pt  describes  Is  A  Flair  Up  Of  Her   Arthritis           She  denys  Any  specefic  Recent  Injury          She  Does  Stand  On  Her  Feet  Quite  Often   At  Work

## 2013-03-25 ENCOUNTER — Ambulatory Visit (HOSPITAL_COMMUNITY)
Admission: RE | Admit: 2013-03-25 | Discharge: 2013-03-25 | Disposition: A | Discharge status: Home / Self Care | Payer: No Typology Code available for payment source | Source: Ambulatory Visit | Attending: Family Medicine | Admitting: Family Medicine

## 2013-03-25 ENCOUNTER — Other Ambulatory Visit (HOSPITAL_COMMUNITY): Payer: Self-pay | Admitting: Family Medicine

## 2013-03-25 DIAGNOSIS — R52 Pain, unspecified: Secondary | ICD-10-CM

## 2013-03-25 DIAGNOSIS — M25559 Pain in unspecified hip: Secondary | ICD-10-CM | POA: Insufficient documentation

## 2013-03-25 DIAGNOSIS — M25579 Pain in unspecified ankle and joints of unspecified foot: Secondary | ICD-10-CM | POA: Insufficient documentation

## 2013-04-27 ENCOUNTER — Other Ambulatory Visit (HOSPITAL_COMMUNITY): Payer: Self-pay | Admitting: Internal Medicine

## 2013-04-27 DIAGNOSIS — Z1231 Encounter for screening mammogram for malignant neoplasm of breast: Secondary | ICD-10-CM

## 2013-05-04 ENCOUNTER — Ambulatory Visit (HOSPITAL_COMMUNITY): Payer: No Typology Code available for payment source

## 2013-07-06 ENCOUNTER — Other Ambulatory Visit (HOSPITAL_COMMUNITY): Payer: Self-pay | Admitting: Nurse Practitioner

## 2013-07-06 ENCOUNTER — Ambulatory Visit (HOSPITAL_COMMUNITY)
Admission: RE | Admit: 2013-07-06 | Discharge: 2013-07-06 | Disposition: A | Discharge status: Home / Self Care | Payer: No Typology Code available for payment source | Source: Ambulatory Visit | Attending: Nurse Practitioner | Admitting: Nurse Practitioner

## 2013-07-06 DIAGNOSIS — M79609 Pain in unspecified limb: Secondary | ICD-10-CM | POA: Insufficient documentation

## 2013-07-06 DIAGNOSIS — M7989 Other specified soft tissue disorders: Secondary | ICD-10-CM | POA: Insufficient documentation

## 2013-07-06 DIAGNOSIS — R52 Pain, unspecified: Secondary | ICD-10-CM

## 2013-07-06 DIAGNOSIS — M2140 Flat foot [pes planus] (acquired), unspecified foot: Secondary | ICD-10-CM | POA: Insufficient documentation

## 2013-07-06 DIAGNOSIS — M7730 Calcaneal spur, unspecified foot: Secondary | ICD-10-CM | POA: Insufficient documentation

## 2013-07-27 ENCOUNTER — Emergency Department (INDEPENDENT_AMBULATORY_CARE_PROVIDER_SITE_OTHER)
Admission: EM | Admit: 2013-07-27 | Discharge: 2013-07-27 | Disposition: A | Discharge status: Home / Self Care | Payer: Self-pay | Source: Home / Self Care | Attending: Family Medicine | Admitting: Family Medicine

## 2013-07-27 ENCOUNTER — Encounter (HOSPITAL_COMMUNITY): Payer: Self-pay | Admitting: Emergency Medicine

## 2013-07-27 DIAGNOSIS — R200 Anesthesia of skin: Secondary | ICD-10-CM

## 2013-07-27 DIAGNOSIS — R209 Unspecified disturbances of skin sensation: Secondary | ICD-10-CM

## 2013-07-27 NOTE — ED Notes (Signed)
Reports bilateral hand numbness when she wakes in the mornings.  Numbness for 4-6 months.  Also complains of hand numbness occurring f she holds something for a long period of time.

## 2013-07-27 NOTE — ED Provider Notes (Addendum)
CSN: [id number]     Arrival date & time 07/27/13  1012 History   First MD Initiated Contact with Patient 07/27/13 1131     Chief Complaint  Patient presents with  . Numbness   (Consider location/radiation/quality/duration/timing/severity/associated sxs/prior Treatment) Patient is a 57 y.o. female presenting with hand pain. The history is provided by the patient.  Hand Pain This is a new problem. The current episode started more than 1 week ago (5-6 mos of sx, worse in am, bilat.). The problem has not changed since onset.   Past Medical History  Diagnosis Date  . Hypertension   . Birthmarks, pigmented     right arm since birth  . Obesity   . Hypercholesterolemia     hypercholesterolemia- diet [place]  . Fibrocystic breast   . Tobacco abuse     1/2 packper day  . Obstructive sleep apnea     mild, no ask recommeded  . Systolic murmur     innocent per pt., evaluateed years ago  . Insomnia    Past Surgical History  Procedure Laterality Date  . Tubal ligation  1984   No family history on file. History  Substance Use Topics  . Smoking status: Current Some Day Smoker -- 0.50 packs/day    Types: Cigarettes  . Smokeless tobacco: Not on file  . Alcohol Use: Yes   OB History   Grav Para Term Preterm Abortions TAB SAB Ect Mult Living                 Review of Systems  Constitutional: Negative.   HENT: Negative for neck pain and neck stiffness.   Musculoskeletal: Negative for myalgias, back pain, joint swelling, arthralgias and gait problem.  Skin: Negative.   Neurological: Positive for numbness. Negative for weakness.    Allergies  Review of patient's allergies indicates no known allergies.  Home Medications   Current Outpatient Rx  Name  Route  Sig  Dispense  Refill  . diclofenac (VOLTAREN) 75 MG EC tablet   Oral   Take 1 tablet (75 mg total) by mouth 2 (two) times daily.   14 tablet   0   . hydrochlorothiazide (HYDRODIURIL) 25 MG tablet   Oral   Take 1 tablet (25  mg total) by mouth daily.   30 tablet   5   . lisinopril (PRINIVIL,ZESTRIL) 40 MG tablet   Oral   Take 1 tablet (40 mg total) by mouth daily.   30 tablet   5   . metoprolol tartrate (LOPRESSOR) 25 MG tablet   Oral   Take 1 tablet (25 mg total) by mouth 2 (two) times daily.   60 tablet   5    BP 132/75  Pulse 60  Temp(Src) 98.1 F (36.7 C) (Oral)  Resp 16  SpO2 97% Physical Exam  Nursing note and vitals reviewed. Constitutional: She is oriented to person, place, and time. She appears well-developed and well-nourished.  Neck: Normal range of motion. Neck supple.  Musculoskeletal: She exhibits no tenderness.  No sx now, describes 4th and 5th finger paresthesias, bilat. No elbow or neck pain. No cerv or axillary adenopathy. Neg phalens and tinels test.  Lymphadenopathy:    She has no cervical adenopathy.  Neurological: She is alert and oriented to person, place, and time. No cranial nerve deficit.  Skin: Skin is warm and dry.    ED Course  Procedures (including critical care time) Labs Review Labs Reviewed - No data to display Imaging Review No  results found.  MDM      Linna Hoff, MD 07/27/13 1139  Linna Hoff, MD 07/27/13 1140

## 2016-08-21 ENCOUNTER — Encounter (HOSPITAL_COMMUNITY): Payer: Self-pay | Admitting: *Deleted

## 2016-08-21 ENCOUNTER — Emergency Department (HOSPITAL_COMMUNITY)
Admission: EM | Admit: 2016-08-21 | Discharge: 2016-08-21 | Disposition: A | Discharge status: Home / Self Care | Payer: Self-pay | Attending: Emergency Medicine | Admitting: Emergency Medicine

## 2016-08-21 DIAGNOSIS — F17210 Nicotine dependence, cigarettes, uncomplicated: Secondary | ICD-10-CM | POA: Insufficient documentation

## 2016-08-21 DIAGNOSIS — I10 Essential (primary) hypertension: Secondary | ICD-10-CM | POA: Insufficient documentation

## 2016-08-21 DIAGNOSIS — I159 Secondary hypertension, unspecified: Secondary | ICD-10-CM | POA: Insufficient documentation

## 2016-08-21 DIAGNOSIS — R51 Headache: Secondary | ICD-10-CM

## 2016-08-21 DIAGNOSIS — R519 Headache, unspecified: Secondary | ICD-10-CM

## 2016-08-21 LAB — COMPREHENSIVE METABOLIC PANEL
ALT: 35 U/L (ref 14–54)
AST: 35 U/L (ref 15–41)
Albumin: 4.3 g/dL (ref 3.5–5.0)
Alkaline Phosphatase: 93 U/L (ref 38–126)
Anion gap: 10 (ref 5–15)
BUN: <5 mg/dL — ABNORMAL LOW (ref 6–20)
CO2: 29 mmol/L (ref 22–32)
Calcium: 9.8 mg/dL (ref 8.9–10.3)
Chloride: 100 mmol/L — ABNORMAL LOW (ref 101–111)
Creatinine, Ser: 0.72 mg/dL (ref 0.44–1.00)
GFR calc Af Amer: >60 mL/min (ref >60)
GFR calc non Af Amer: >60 mL/min (ref >60)
Glucose, Bld: 116 mg/dL — ABNORMAL HIGH (ref 65–99)
Potassium: 3.3 mmol/L — ABNORMAL LOW (ref 3.5–5.1)
Sodium: 139 mmol/L (ref 135–145)
Total Bilirubin: 0.8 mg/dL (ref 0.3–1.2)
Total Protein: 8.7 g/dL — ABNORMAL HIGH (ref 6.5–8.1)

## 2016-08-21 LAB — CBC
HCT: 46.6 % — ABNORMAL HIGH (ref 36.0–46.0)
Hemoglobin: 16.1 g/dL — ABNORMAL HIGH (ref 12.0–15.0)
MCH: 30.8 pg (ref 26.0–34.0)
MCHC: 34.5 g/dL (ref 30.0–36.0)
MCV: 89.3 fL (ref 78.0–100.0)
Platelets: 300 10*3/uL (ref 150–400)
RBC: 5.22 MIL/uL — ABNORMAL HIGH (ref 3.87–5.11)
RDW: 13.7 % (ref 11.5–15.5)
WBC: 9.9 10*3/uL (ref 4.0–10.5)

## 2016-08-21 LAB — I-STAT TROPONIN, ED: Troponin i, poc: 0.01 ng/mL (ref 0.00–0.08)

## 2016-08-21 MED ORDER — METOPROLOL TARTRATE 25 MG PO TABS: 25.0000 mg | ORAL_TABLET | Freq: Two times a day (BID) | ORAL | 1 refills | Status: DC

## 2016-08-21 MED ORDER — HYDROCHLOROTHIAZIDE 25 MG PO TABS
25.0000 mg | ORAL_TABLET | Freq: Once | ORAL | Status: AC
  Administered 2016-08-21: 25 mg via ORAL
  Filled 2016-08-21: qty 1

## 2016-08-21 MED ORDER — KETOROLAC TROMETHAMINE 15 MG/ML IJ SOLN
15.0000 mg | Freq: Once | INTRAMUSCULAR | Status: AC
  Administered 2016-08-21: 15 mg via INTRAMUSCULAR
  Filled 2016-08-21: qty 1

## 2016-08-21 MED ORDER — LISINOPRIL 40 MG PO TABS: 40.0000 mg | ORAL_TABLET | Freq: Every day | ORAL | 1 refills | Status: DC

## 2016-08-21 MED ORDER — HYDROCHLOROTHIAZIDE 25 MG PO TABS: 25.0000 mg | ORAL_TABLET | Freq: Every day | ORAL | 1 refills | Status: DC

## 2016-08-21 MED ORDER — LORAZEPAM 1 MG PO TABS
0.5000 mg | ORAL_TABLET | Freq: Once | ORAL | Status: AC
  Administered 2016-08-21: 0.5 mg via ORAL
  Filled 2016-08-21: qty 1

## 2016-08-21 MED ORDER — METOPROLOL TARTRATE 25 MG PO TABS
25.0000 mg | ORAL_TABLET | Freq: Once | ORAL | Status: AC
  Administered 2016-08-21: 25 mg via ORAL
  Filled 2016-08-21: qty 1

## 2016-08-21 MED ORDER — LISINOPRIL 20 MG PO TABS
20.0000 mg | ORAL_TABLET | Freq: Once | ORAL | Status: AC
  Administered 2016-08-21: 20 mg via ORAL
  Filled 2016-08-21: qty 1

## 2016-08-21 NOTE — ED Provider Notes (Signed)
[facility]-EMERGENCY DEPT Provider Note   CSN: [id number] Arrival date & time: 08/21/16  1018   By signing my name below, I, Freida Busmaniana Omoyeni, attest that this documentation has been prepared under the direction and in the presence of Raeford RazorStephen Veona Bittman, MD . Electronically Signed: Freida Busmaniana Omoyeni, Scribe. 08/21/2016. 1:48 PM.   History   Chief Complaint Chief Complaint  Patient presents with  . Leg Pain  . Headache    The history is provided by the patient. No language interpreter was used.    HPI Comments:  Selena Mclean is a 60 y.o. female who presents to the Emergency Department complaining of BLE pain x a few weeks. She reports pain from her feet up to her thigh. Pt describes occasional sharp pain in her feet. She reports associated BLE swelling and intermittent numbness to the feet. She has also been experiencing a frontal HA x a few weeks. She notes intermittent left sided neck pain. She denies fever, CP, and SOB. She also denies h/o DM. No alleviating factors noted. Pt has a h/o HTN and states she has been without her daily meds x 1 month  No PCP     Past Medical History:  Diagnosis Date  . Birthmarks, pigmented    right arm since birth  . Fibrocystic breast   . Hypercholesterolemia    hypercholesterolemia- diet [place]  . Hypertension   . Insomnia   . Obesity   . Obstructive sleep apnea    mild, no ask recommeded  . Systolic murmur    innocent per pt., evaluateed years ago  . Tobacco abuse    1/2 packper day    Patient Active Problem List   Diagnosis Date Noted  . SLEEP DISORDER 09/27/2008  . HYPERCHOLESTEROLEMIA 11/06/2006  . OBESITY 11/06/2006  . TOBACCO ABUSE 11/06/2006  . HYPERTENSION 11/06/2006  . FIBROCYSTIC BREAST DISEASE 11/06/2006  . INSOMNIA 11/06/2006  . SLEEP APNEA 11/06/2006  . SYSTOLIC MURMUR 11/06/2006    Past Surgical History:  Procedure Laterality Date  . TUBAL LIGATION  1984    OB History    No data available       Home Medications     Prior to Admission medications   Medication Sig Start Date End Date Taking? Authorizing Provider  diclofenac (VOLTAREN) 75 MG EC tablet Take 1 tablet (75 mg total) by mouth 2 (two) times daily. Patient not taking: Reported on 08/21/2016 02/26/13   Jimmie MollyPaolo Coll, MD  hydrochlorothiazide (HYDRODIURIL) 25 MG tablet Take 1 tablet (25 mg total) by mouth daily. Patient not taking: Reported on 08/21/2016 07/07/12   Sunday Spillersavi C Patel, MD  lisinopril (PRINIVIL,ZESTRIL) 40 MG tablet Take 1 tablet (40 mg total) by mouth daily. Patient not taking: Reported on 08/21/2016 07/07/12   Sunday Spillersavi C Patel, MD  metoprolol tartrate (LOPRESSOR) 25 MG tablet Take 1 tablet (25 mg total) by mouth 2 (two) times daily. Patient not taking: Reported on 08/21/2016 07/07/12   Sunday Spillersavi C Patel, MD    Family History History reviewed. No pertinent family history.  Social History Social History  Substance Use Topics  . Smoking status: Current Some Day Smoker    Packs/day: 0.50    Types: Cigarettes  . Smokeless tobacco: Not on file  . Alcohol use Yes     Allergies   Review of patient's allergies indicates no known allergies.   Review of Systems Review of Systems  Constitutional: Negative for chills and fever.  Respiratory: Negative for shortness of breath.   Cardiovascular: Negative for chest  pain.  Musculoskeletal: Positive for myalgias (BLE) and neck pain.  Neurological: Positive for numbness (bilateral feet) and headaches. Negative for weakness.  All other systems reviewed and are negative.   Physical Exam Updated Vital Signs BP (!) 204/95   Pulse 72   Temp 98.1 F (36.7 C) (Oral)   Resp 20   Ht 5\' 1"  (1.549 m)   Wt 192 lb 12.8 oz (87.5 kg)   SpO2 99%   BMI 36.43 kg/m   Physical Exam  Constitutional: She is oriented to person, place, and time. She appears well-developed and well-nourished. No distress.  HENT:  Head: Normocephalic and atraumatic.  Mild tenderness over frontal sinuses   Eyes: EOM are normal.   Neck: Normal range of motion.  Cardiovascular: Normal rate, regular rhythm and normal heart sounds.   Pulmonary/Chest: Effort normal and breath sounds normal.  Abdominal: Soft. She exhibits no distension. There is no tenderness.  Musculoskeletal: Normal range of motion.  Neurological: She is alert and oriented to person, place, and time.  Skin: Skin is warm and dry.  Psychiatric: She has a normal mood and affect. Judgment normal.  Nursing note and vitals reviewed.    ED Treatments / Results  DIAGNOSTIC STUDIES:  Oxygen Saturation is 99% on RA, normal by my interpretation.    COORDINATION OF CARE:  12:06 PM Discussed treatment plan with pt at bedside and pt agreed to plan.  Labs (all labs ordered are listed, but only abnormal results are displayed) Labs Reviewed  COMPREHENSIVE METABOLIC PANEL - Abnormal; Notable for the following:       Result Value   Potassium 3.3 (*)    Chloride 100 (*)    Glucose, Bld 116 (*)    BUN <5 (*)    Total Protein 8.7 (*)    All other components within normal limits  CBC - Abnormal; Notable for the following:    RBC 5.22 (*)    Hemoglobin 16.1 (*)    HCT 46.6 (*)    All other components within normal limits  I-STAT TROPOININ, ED    EKG  EKG Interpretation None       Radiology No results found.  Procedures Procedures (including critical care time)  Medications Ordered in ED Medications  metoprolol tartrate (LOPRESSOR) tablet 25 mg (25 mg Oral Given 08/21/16 1240)  hydrochlorothiazide (HYDRODIURIL) tablet 25 mg (25 mg Oral Given 08/21/16 1240)  lisinopril (PRINIVIL,ZESTRIL) tablet 20 mg (20 mg Oral Given 08/21/16 1240)  ketorolac (TORADOL) 15 MG/ML injection 15 mg (15 mg Intramuscular Given 08/21/16 1241)  LORazepam (ATIVAN) tablet 0.5 mg (0.5 mg Oral Given 08/21/16 1241)     Initial Impression / Assessment and Plan / ED Course  I have reviewed the triage vital signs and the nursing notes.  Pertinent labs & imaging results that  were available during my care of the patient were reviewed by me and considered in my medical decision making (see chart for details).  Clinical Course    60yF with LE pain. Neuropathy. She is NVI. Doubt critical arterial compromise or DVT. No concerning skin lesions noted. Motor function is fine. Plan symptomatic treatment. Provided with prescriptions for blood pressure medication. Return precautions were discussed. Outpatient follow-up otherwise.  Final Clinical Impressions(s) / ED Diagnoses   Final diagnoses:  Nonintractable headache, unspecified chronicity pattern, unspecified headache type  Secondary hypertension    New Prescriptions New Prescriptions   No medications on file    I personally preformed the services scribed in my presence. The recorded  information has been reviewed is accurate. Raeford Razor, MD.     Raeford Razor, MD 08/26/16 [phone]

## 2016-08-21 NOTE — ED Triage Notes (Signed)
Pt reports having a headache and bilateral leg and foot pain for several days. Reports being out of bp meds x 1 month.

## 2016-11-22 ENCOUNTER — Encounter (HOSPITAL_COMMUNITY): Payer: Self-pay

## 2016-11-22 ENCOUNTER — Emergency Department (HOSPITAL_COMMUNITY): Payer: Self-pay

## 2016-11-22 ENCOUNTER — Emergency Department (HOSPITAL_COMMUNITY)
Admission: EM | Admit: 2016-11-22 | Discharge: 2016-11-22 | Disposition: A | Discharge status: Home / Self Care | Payer: Self-pay | Attending: Emergency Medicine | Admitting: Emergency Medicine

## 2016-11-22 DIAGNOSIS — F1410 Cocaine abuse, uncomplicated: Secondary | ICD-10-CM

## 2016-11-22 DIAGNOSIS — F1490 Cocaine use, unspecified, uncomplicated: Secondary | ICD-10-CM | POA: Insufficient documentation

## 2016-11-22 DIAGNOSIS — I10 Essential (primary) hypertension: Secondary | ICD-10-CM | POA: Insufficient documentation

## 2016-11-22 DIAGNOSIS — Z79899 Other long term (current) drug therapy: Secondary | ICD-10-CM | POA: Insufficient documentation

## 2016-11-22 DIAGNOSIS — I674 Hypertensive encephalopathy: Secondary | ICD-10-CM | POA: Insufficient documentation

## 2016-11-22 DIAGNOSIS — R791 Abnormal coagulation profile: Secondary | ICD-10-CM | POA: Insufficient documentation

## 2016-11-22 DIAGNOSIS — F17210 Nicotine dependence, cigarettes, uncomplicated: Secondary | ICD-10-CM | POA: Insufficient documentation

## 2016-11-22 LAB — CBC WITH DIFFERENTIAL/PLATELET
Basophils Absolute: 0 10*3/uL (ref 0.0–0.1)
Basophils Relative: 0 %
EOS ABS: 0 10*3/uL (ref 0.0–0.7)
Eosinophils Relative: 0 %
HEMATOCRIT: 45.1 % (ref 36.0–46.0)
HEMOGLOBIN: 15.7 g/dL — AB (ref 12.0–15.0)
LYMPHS PCT: 33 %
Lymphs Abs: 3.4 10*3/uL (ref 0.7–4.0)
MCH: 30.5 pg (ref 26.0–34.0)
MCHC: 34.8 g/dL (ref 30.0–36.0)
MCV: 87.6 fL (ref 78.0–100.0)
MONOS PCT: 4 %
Monocytes Absolute: 0.4 10*3/uL (ref 0.1–1.0)
NEUTROS ABS: 6.7 10*3/uL (ref 1.7–7.7)
NEUTROS PCT: 63 %
Platelets: 287 10*3/uL (ref 150–400)
RBC: 5.15 MIL/uL — AB (ref 3.87–5.11)
RDW: 12.6 % (ref 11.5–15.5)
WBC: 10.6 10*3/uL — AB (ref 4.0–10.5)

## 2016-11-22 LAB — URINALYSIS, ROUTINE W REFLEX MICROSCOPIC
BILIRUBIN URINE: NEGATIVE
Glucose, UA: NEGATIVE mg/dL
HGB URINE DIPSTICK: NEGATIVE
KETONES UR: 20 mg/dL — AB
Leukocytes, UA: NEGATIVE
Nitrite: NEGATIVE
PROTEIN: NEGATIVE mg/dL
Specific Gravity, Urine: 1.005 (ref 1.005–1.030)
pH: 5 (ref 5.0–8.0)

## 2016-11-22 LAB — COMPREHENSIVE METABOLIC PANEL
ALK PHOS: 82 U/L (ref 38–126)
ALT: 35 U/L (ref 14–54)
AST: 32 U/L (ref 15–41)
Albumin: 4.2 g/dL (ref 3.5–5.0)
Anion gap: 11 (ref 5–15)
BILIRUBIN TOTAL: 1.3 mg/dL — AB (ref 0.3–1.2)
BUN: 6 mg/dL (ref 6–20)
CALCIUM: 9.6 mg/dL (ref 8.9–10.3)
CO2: 26 mmol/L (ref 22–32)
CREATININE: 0.77 mg/dL (ref 0.44–1.00)
Chloride: 98 mmol/L — ABNORMAL LOW (ref 101–111)
GFR calc non Af Amer: >60 mL/min (ref >60)
GLUCOSE: 99 mg/dL (ref 65–99)
Potassium: 3.8 mmol/L (ref 3.5–5.1)
SODIUM: 135 mmol/L (ref 135–145)
TOTAL PROTEIN: 8.4 g/dL — AB (ref 6.5–8.1)

## 2016-11-22 LAB — RAPID URINE DRUG SCREEN, HOSP PERFORMED
Amphetamines: NOT DETECTED
BARBITURATES: NOT DETECTED
Benzodiazepines: NOT DETECTED
COCAINE: POSITIVE — AB
Opiates: NOT DETECTED
Tetrahydrocannabinol: NOT DETECTED

## 2016-11-22 LAB — TROPONIN I

## 2016-11-22 LAB — PROTIME-INR
INR: 1.01
PROTHROMBIN TIME: 13.4 s (ref 11.4–15.2)

## 2016-11-22 LAB — ETHANOL

## 2016-11-22 MED ORDER — LISINOPRIL 40 MG PO TABS: 40.0000 mg | ORAL_TABLET | Freq: Every day | ORAL | 1 refills | Status: DC

## 2016-11-22 MED ORDER — HYDROCHLOROTHIAZIDE 25 MG PO TABS: 25.0000 mg | ORAL_TABLET | Freq: Every day | ORAL | 1 refills | Status: DC

## 2016-11-22 MED ORDER — HYDRALAZINE HCL 20 MG/ML IJ SOLN
10.0000 mg | Freq: Once | INTRAMUSCULAR | Status: AC
  Administered 2016-11-22: 10 mg via INTRAVENOUS
  Filled 2016-11-22: qty 1

## 2016-11-22 NOTE — ED Notes (Signed)
Pt. Ambulated to restroom unassisted with steady gait.  

## 2016-11-22 NOTE — ED Notes (Signed)
Patient undressed, in gown, on monitor, continuous pulse oximetry and blood pressure cuff 

## 2016-11-22 NOTE — ED Notes (Signed)
RN accompanied pt. To CT scan.

## 2016-11-22 NOTE — ED Notes (Signed)
Patient being taken to CT at this time.

## 2016-11-22 NOTE — Discharge Instructions (Signed)
Do not use cocaine. 

## 2016-11-22 NOTE — ED Notes (Signed)
Pt. Ambulated unassisted with steady gait

## 2016-11-22 NOTE — ED Notes (Signed)
Pt. Reports smoking crack last night.

## 2016-11-22 NOTE — ED Provider Notes (Signed)
[facility]-EMERGENCY DEPT Provider Note   CSN: [id number] Arrival date & time: 11/22/16  0754     History   Chief Complaint Chief Complaint  Patient presents with  . Altered Mental Status  . Hypertension    HPI Selena Mclean is a 61 y.o. female.  Pt presents to the ED today with altered mental status.  The pt has a hx of htn, but has not been taking her medications because she did not have them.  Pt called EMS because she did not feel right and thought she was having a stroke.  EMS said she was able to walk at home, but would occasionally walk like she was going to do something, but then would stop and not remember what she was doing.  Pt denies any pain.  Pt did admit to the nurse that she used cocaine last night.      Past Medical History:  Diagnosis Date  . Birthmarks, pigmented    right arm since birth  . Fibrocystic breast   . Hypercholesterolemia    hypercholesterolemia- diet [place]  . Hypertension   . Insomnia   . Obesity   . Obstructive sleep apnea    mild, no ask recommeded  . Systolic murmur    innocent per pt., evaluateed years ago  . Tobacco abuse    1/2 packper day    Patient Active Problem List   Diagnosis Date Noted  . SLEEP DISORDER 09/27/2008  . HYPERCHOLESTEROLEMIA 11/06/2006  . OBESITY 11/06/2006  . TOBACCO ABUSE 11/06/2006  . HYPERTENSION 11/06/2006  . FIBROCYSTIC BREAST DISEASE 11/06/2006  . INSOMNIA 11/06/2006  . SLEEP APNEA 11/06/2006  . SYSTOLIC MURMUR 11/06/2006    Past Surgical History:  Procedure Laterality Date  . TUBAL LIGATION  1984    OB History    No data available       Home Medications    Prior to Admission medications   Medication Sig Start Date End Date Taking? Authorizing Provider  hydrochlorothiazide (HYDRODIURIL) 25 MG tablet Take 1 tablet (25 mg total) by mouth daily. 11/22/16   Jacalyn Lefevre, MD  lisinopril (PRINIVIL,ZESTRIL) 40 MG tablet Take 1 tablet (40 mg total) by mouth daily. 11/22/16   Jacalyn Lefevre, MD    metoprolol tartrate (LOPRESSOR) 25 MG tablet Take 1 tablet (25 mg total) by mouth 2 (two) times daily. Patient not taking: Reported on 11/22/2016 08/21/16   Raeford Razor, MD    Family History History reviewed. No pertinent family history.  Social History Social History  Substance Use Topics  . Smoking status: Current Some Day Smoker    Packs/day: 0.50    Types: Cigarettes  . Smokeless tobacco: Never Used  . Alcohol use Yes     Allergies   Patient has no known allergies.   Review of Systems Review of Systems  Neurological: Positive for weakness.  All other systems reviewed and are negative.    Physical Exam Updated Vital Signs BP (!) 162/52   Pulse 95   Temp 98.4 F (36.9 C) (Oral)   Resp 19   Ht 5\' 2"  (1.575 m)   Wt 192 lb (87.1 kg)   SpO2 99%   BMI 35.12 kg/m   Physical Exam  Constitutional: She is oriented to person, place, and time. She appears well-developed and well-nourished.  HENT:  Head: Normocephalic and atraumatic.  Right Ear: External ear normal.  Left Ear: External ear normal.  Nose: Nose normal.  Mouth/Throat: Oropharynx is clear and moist.  Eyes: Conjunctivae and EOM  are normal. Pupils are equal, round, and reactive to light.  Neck: Normal range of motion. Neck supple.  Cardiovascular: Normal rate, regular rhythm, normal heart sounds and intact distal pulses.   Pulmonary/Chest: Effort normal and breath sounds normal.  Abdominal: Soft. Bowel sounds are normal.  Musculoskeletal: Normal range of motion.  Neurological: She is alert and oriented to person, place, and time.  Pt is slow to respond, but will follow all commands.  Skin: Skin is warm. Capillary refill takes less than 2 seconds.  Psychiatric: She has a normal mood and affect. Her behavior is normal. Judgment and thought content normal.  Nursing note and vitals reviewed.    ED Treatments / Results  Labs (all labs ordered are listed, but only abnormal results are displayed) Labs  Reviewed  CBC WITH DIFFERENTIAL/PLATELET - Abnormal; Notable for the following:       Result Value   WBC 10.6 (*)    RBC 5.15 (*)    Hemoglobin 15.7 (*)    All other components within normal limits  COMPREHENSIVE METABOLIC PANEL - Abnormal; Notable for the following:    Chloride 98 (*)    Total Protein 8.4 (*)    Total Bilirubin 1.3 (*)    All other components within normal limits  RAPID URINE DRUG SCREEN, HOSP PERFORMED - Abnormal; Notable for the following:    Cocaine POSITIVE (*)    All other components within normal limits  URINALYSIS, ROUTINE W REFLEX MICROSCOPIC - Abnormal; Notable for the following:    Ketones, ur 20 (*)    All other components within normal limits  ETHANOL  PROTIME-INR  TROPONIN I    EKG  EKG Interpretation  Date/Time:  Friday November 22 2016 08:07:47 EST Ventricular Rate:  90 PR Interval:    QRS Duration: 84 QT Interval:  383 QTC Calculation: 469 R Axis:   15 Text Interpretation:  Sinus rhythm Probable anteroseptal infarct, recent Confirmed by [facility] MD, Vegas Fritze ([id number]) on 11/22/2016 8:17:28 AM       Radiology Dg Chest 2 View  Result Date: 11/22/2016 CLINICAL DATA:  Left frontal headache. EXAM: CHEST  2 VIEW COMPARISON:  None. FINDINGS: The heart size and mediastinal contours are within normal limits. Both lungs are clear. The visualized skeletal structures are unremarkable. IMPRESSION: No active cardiopulmonary disease. Electronically Signed   By: Elige KoHetal  Patel   On: 11/22/2016 08:42   Ct Head Wo Contrast  Result Date: 11/22/2016 CLINICAL DATA:  LEFT frontal headache, confusion, hypertension, cocaine use last night, smoker EXAM: CT HEAD WITHOUT CONTRAST TECHNIQUE: Contiguous axial images were obtained from the base of the skull through the vertex without intravenous contrast. COMPARISON:  None FINDINGS: Brain: Minimal age-related atrophy. Normal ventricular morphology. No midline shift or mass effect. Small vessel chronic ischemic changes of deep  cerebral white matter. No intracranial hemorrhage, mass lesion, evidence of acute infarction, or extra-axial fluid collection. Vascular: Unremarkable Skull: Intact Sinuses/Orbits: Visualized paranasal sinuses and mastoid air cells clear Other: N/A IMPRESSION: Small vessel chronic ischemic changes of deep cerebral white matter. No acute intracranial abnormalities. Electronically Signed   By: Ulyses SouthwardMark  Boles M.D.   On: 11/22/2016 08:33    Procedures Procedures (including critical care time)  Medications Ordered in ED Medications  hydrALAZINE (APRESOLINE) injection 10 mg (10 mg Intravenous Given 11/22/16 0900)     Initial Impression / Assessment and Plan / ED Course  I have reviewed the triage vital signs and the nursing notes.  Pertinent labs & imaging results that were available  during my care of the patient were reviewed by me and considered in my medical decision making (see chart for details).  Clinical Course    Pt's ms improved after BP improved.  Pt feels back to normal now.  Pt encouraged to take her bp meds.  I gave her a refill.  The pt also told not to use cocaine.    SW saw pt and made her an appt with her pcp.    Pt knows to return if worse.  Final Clinical Impressions(s) / ED Diagnoses   Final diagnoses:  Essential hypertension  Cocaine abuse  Hypertensive encephalopathy    New Prescriptions Current Discharge Medication List       Jacalyn Lefevre, MD 11/22/16 1206

## 2016-11-22 NOTE — ED Triage Notes (Signed)
Pt. Coming from home via [facility] for altered mental status. Pt. Called EMS for possible stroke. Pt. BP 240/120 on scene and patient not acting right per EMS. Pt. Ambulatory on scene, PEERLA, and equal grip strength. EDP at bedside. Pt. Aox4, but slow to respond.

## 2016-12-05 ENCOUNTER — Ambulatory Visit: Payer: Self-pay | Admitting: Family Medicine

## 2016-12-09 ENCOUNTER — Ambulatory Visit: Payer: Self-pay | Admitting: Family Medicine

## 2017-06-11 ENCOUNTER — Emergency Department (HOSPITAL_COMMUNITY)
Admission: EM | Admit: 2017-06-11 | Discharge: 2017-06-11 | Disposition: A | Discharge status: Home / Self Care | Payer: Self-pay | Attending: Emergency Medicine | Admitting: Emergency Medicine

## 2017-06-11 ENCOUNTER — Encounter (HOSPITAL_COMMUNITY): Payer: Self-pay | Admitting: Emergency Medicine

## 2017-06-11 DIAGNOSIS — R51 Headache: Secondary | ICD-10-CM

## 2017-06-11 DIAGNOSIS — Z659 Problem related to unspecified psychosocial circumstances: Secondary | ICD-10-CM | POA: Insufficient documentation

## 2017-06-11 DIAGNOSIS — F17210 Nicotine dependence, cigarettes, uncomplicated: Secondary | ICD-10-CM | POA: Insufficient documentation

## 2017-06-11 DIAGNOSIS — R519 Headache, unspecified: Secondary | ICD-10-CM

## 2017-06-11 DIAGNOSIS — F1410 Cocaine abuse, uncomplicated: Secondary | ICD-10-CM | POA: Insufficient documentation

## 2017-06-11 DIAGNOSIS — I10 Essential (primary) hypertension: Secondary | ICD-10-CM | POA: Insufficient documentation

## 2017-06-11 DIAGNOSIS — Z79899 Other long term (current) drug therapy: Secondary | ICD-10-CM | POA: Insufficient documentation

## 2017-06-11 MED ORDER — DIPHENHYDRAMINE HCL 50 MG/ML IJ SOLN
12.5000 mg | Freq: Once | INTRAMUSCULAR | Status: AC
  Administered 2017-06-11: 12.5 mg via INTRAVENOUS
  Filled 2017-06-11: qty 1

## 2017-06-11 MED ORDER — LISINOPRIL 20 MG PO TABS
40.0000 mg | ORAL_TABLET | Freq: Every day | ORAL | Status: DC
  Administered 2017-06-11: 40 mg via ORAL
  Filled 2017-06-11: qty 2

## 2017-06-11 MED ORDER — HYDROCHLOROTHIAZIDE 25 MG PO TABS
25.0000 mg | ORAL_TABLET | Freq: Every day | ORAL | Status: DC
  Administered 2017-06-11: 25 mg via ORAL
  Filled 2017-06-11: qty 1

## 2017-06-11 MED ORDER — DEXAMETHASONE SODIUM PHOSPHATE 10 MG/ML IJ SOLN
10.0000 mg | Freq: Once | INTRAMUSCULAR | Status: AC
  Administered 2017-06-11: 10 mg via INTRAVENOUS
  Filled 2017-06-11: qty 1

## 2017-06-11 MED ORDER — PROCHLORPERAZINE EDISYLATE 5 MG/ML IJ SOLN
5.0000 mg | Freq: Once | INTRAMUSCULAR | Status: AC
  Administered 2017-06-11: 5 mg via INTRAMUSCULAR
  Filled 2017-06-11: qty 2

## 2017-06-11 MED ORDER — SODIUM CHLORIDE 0.9 % IV BOLUS (SEPSIS)
500.0000 mL | Freq: Once | INTRAVENOUS | Status: AC
  Administered 2017-06-11: 500 mL via INTRAVENOUS

## 2017-06-11 NOTE — ED Notes (Signed)
Pt ambulatory to bathroom

## 2017-06-11 NOTE — ED Notes (Signed)
Social work at bedside.  

## 2017-06-11 NOTE — ED Triage Notes (Addendum)
Pt presents via [facility] for HA and HTN. Systolic 190s with [facility]. Cocaine use yesterday after 6-8 month clean. Deneis CP/SOB. Alert and oriented. Neuro intact.

## 2017-06-11 NOTE — Discharge Instructions (Signed)

## 2017-06-11 NOTE — ED Provider Notes (Signed)
[facility]-EMERGENCY DEPT Provider Note   CSN: [id number] Arrival date & time: 06/11/17  1049     History   Chief Complaint Chief Complaint  Patient presents with  . Headache    HPI Selena Mclean is a [age] y.o. female who presents emergency Department for headache and high blood pressure. Patient states that she is under significant social stress. She just lost her job. Patient states that last night she did cocaine because she was feeling so poorly. She denies her hypertension medications for the past 4 days. She is facing homelessness and is very worried. She states that she has had a headache for the past several days, which she thinks is due to poor sleep and stress. She describes it as global, aching. She has taken 2 ibuprofen this morning without significant relief. She denies any neck stiffness, visual disturbances, fevers, thunderclap onset. She denies any other neurologic symptoms. Patient denies chest pain or shortness of breath.  HPI  Past Medical History:  Diagnosis Date  . Birthmarks, pigmented    right arm since birth  . Fibrocystic breast   . Hypercholesterolemia    hypercholesterolemia- diet [place]  . Hypertension   . Insomnia   . Obesity   . Obstructive sleep apnea    mild, no ask recommeded  . Systolic murmur    innocent per pt., evaluateed years ago  . Tobacco abuse    1/2 packper day    Patient Active Problem List   Diagnosis Date Noted  . SLEEP DISORDER 09/27/2008  . HYPERCHOLESTEROLEMIA 11/06/2006  . OBESITY 11/06/2006  . TOBACCO ABUSE 11/06/2006  . HYPERTENSION 11/06/2006  . FIBROCYSTIC BREAST DISEASE 11/06/2006  . INSOMNIA 11/06/2006  . SLEEP APNEA 11/06/2006  . SYSTOLIC MURMUR 11/06/2006    Past Surgical History:  Procedure Laterality Date  . TUBAL LIGATION  1984    OB History    No data available       Home Medications    Prior to Admission medications   Medication Sig Start Date End Date Taking? Authorizing Provider    hydrochlorothiazide (HYDRODIURIL) 25 MG tablet Take 1 tablet (25 mg total) by mouth daily. 11/22/16  Yes Jacalyn LefevreHaviland, Julie, MD  lisinopril (PRINIVIL,ZESTRIL) 40 MG tablet Take 1 tablet (40 mg total) by mouth daily. 11/22/16  Yes Jacalyn LefevreHaviland, Julie, MD    Family History History reviewed. No pertinent family history.  Social History Social History  Substance Use Topics  . Smoking status: Current Some Day Smoker    Packs/day: 0.50    Types: Cigarettes  . Smokeless tobacco: Never Used  . Alcohol use Yes     Allergies   Patient has no known allergies.   Review of Systems Review of Systems Ten systems reviewed and are negative for acute change, except as noted in the HPI.    Physical Exam Updated Vital Signs BP (!) 172/81   Pulse 68   Temp 98.5 F (36.9 C) (Oral)   Resp 16   SpO2 98%   Physical Exam  Constitutional: She is oriented to person, place, and time. She appears well-developed and well-nourished. No distress.  HENT:  Head: Normocephalic and atraumatic.  Mouth/Throat: Oropharynx is clear and moist.  Eyes: Pupils are equal, round, and reactive to light. Conjunctivae and EOM are normal. No scleral icterus.  No horizontal, vertical or rotational nystagmus  Neck: Normal range of motion. Neck supple.  Full active and passive ROM without pain No midline or paraspinal tenderness No nuchal rigidity or meningeal signs  Cardiovascular:  Normal rate, regular rhythm and intact distal pulses.   Pulmonary/Chest: Effort normal and breath sounds normal. No respiratory distress. She has no wheezes. She has no rales.  Abdominal: Soft. Bowel sounds are normal. There is no tenderness. There is no rebound and no guarding.  Musculoskeletal: Normal range of motion.  Lymphadenopathy:    She has no cervical adenopathy.  Neurological: She is alert and oriented to person, place, and time. No cranial nerve deficit. She exhibits normal muscle tone. Coordination normal.  Mental Status:  Alert,  oriented, thought content appropriate. Speech fluent without evidence of aphasia. Able to follow 2 step commands without difficulty.  Cranial Nerves:  II:  Peripheral visual fields grossly normal, pupils equal, round, reactive to light III,IV, VI: ptosis not present, extra-ocular motions intact bilaterally  V,VII: smile symmetric, facial light touch sensation equal VIII: hearing grossly normal bilaterally  IX,X: midline uvula rise  XI: bilateral shoulder shrug equal and strong XII: midline tongue extension  Motor:  5/5 in upper and lower extremities bilaterally including strong and equal grip strength and dorsiflexion/plantar flexion Sensory: Pinprick and light touch normal in all extremities.  Cerebellar: normal finger-to-nose with bilateral upper extremities Gait: normal gait and balance CV: distal pulses palpable throughout   Skin: Skin is warm and dry. No rash noted. She is not diaphoretic.  Psychiatric: She has a normal mood and affect. Her behavior is normal. Judgment and thought content normal.  Nursing note and vitals reviewed.    ED Treatments / Results  Labs (all labs ordered are listed, but only abnormal results are displayed) Labs Reviewed - No data to display  EKG  EKG Interpretation None       Radiology No results found.  Procedures Procedures (including critical care time)  Medications Ordered in ED Medications - No data to display   Initial Impression / Assessment and Plan / ED Course  I have reviewed the triage vital signs and the nursing notes.  Pertinent labs & imaging results that were available during my care of the patient were reviewed by me and considered in my medical decision making (see chart for details).    BP (!) 168/102   Pulse 70   Temp 98.5 F (36.9 C) (Oral)   Resp 16   SpO2 99%  F/u with pcp on BP. Social work has given OP resources. Pt HA treated and improved while in ED.  Presentation is like pts typical HA and non  concerning [facility], ICH, Meningitis, or temporal arteritis. Pt is afebrile with no focal neuro deficits, nuchal rigidity, or change in vision. Pt is to follow up with PCP to discuss prophylactic medication. Pt verbalizes understanding and is agreeable with plan to dc.    Final Clinical Impressions(s) / ED Diagnoses   Final diagnoses:  Hypertension, unspecified type  Bad headache  Cocaine abuse  Social problem    New Prescriptions New Prescriptions   No medications on file     Arthor CaptainHarris, Kesa Birky, PA-C 06/13/17 1813    Little, Ambrose Finlandachel Morgan, MD 06/17/17 [phone]

## 2017-06-11 NOTE — ED Notes (Signed)
ED Provider at bedside. 

## 2017-06-11 NOTE — ED Notes (Signed)
Pt given two bus passes via social work

## 2017-06-30 ENCOUNTER — Ambulatory Visit: Payer: Self-pay | Admitting: Family Medicine

## 2017-12-03 IMAGING — CT CT HEAD W/O CM
3 of 4 series · 15 of 47 positions shown, 18 images · non-contrast
Comparison: None

CLINICAL DATA: LEFT frontal headache, confusion, hypertension,
cocaine use last night, smoker

EXAM:
CT HEAD WITHOUT CONTRAST
TECHNIQUE: Contiguous axial images were obtained from the base of the skull
through the vertex without intravenous contrast.

[Series 3: head 2.0 h70h · axial · 0.46mm/px · z∈[-112,+2]mm · 9 of 73 slices shown, 12 images]
[im 8/73  brain]
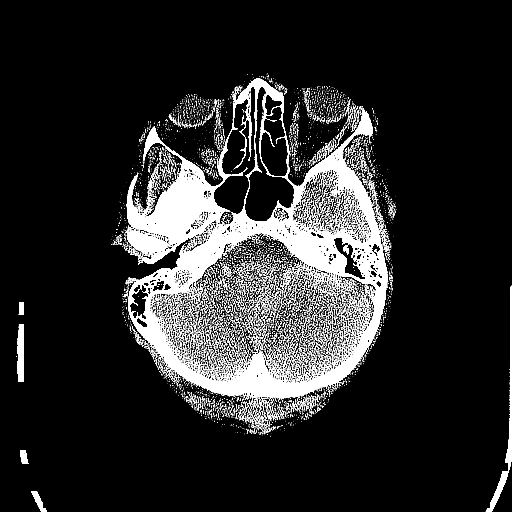
[im 8/73  bone]
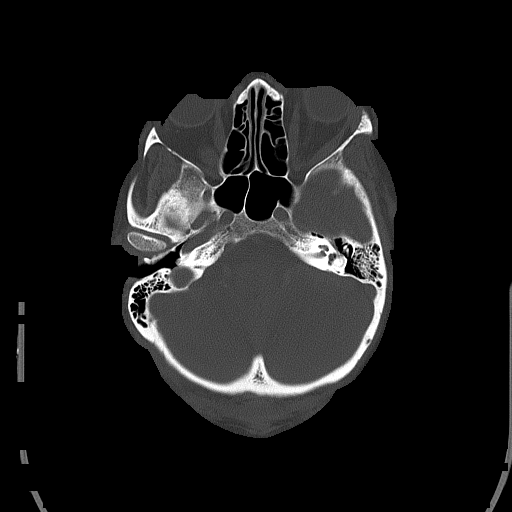
[im 15/73  brain]
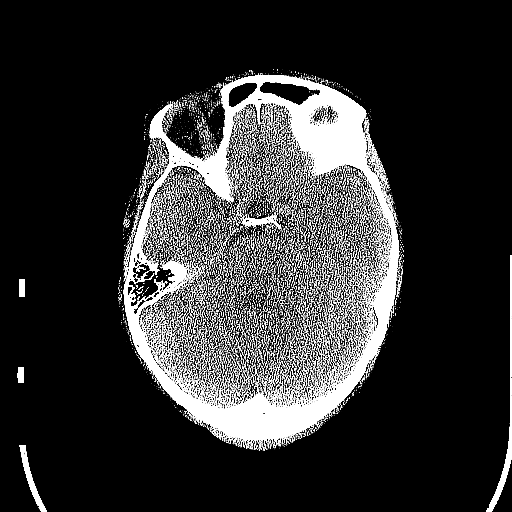
[im 22/73  brain]
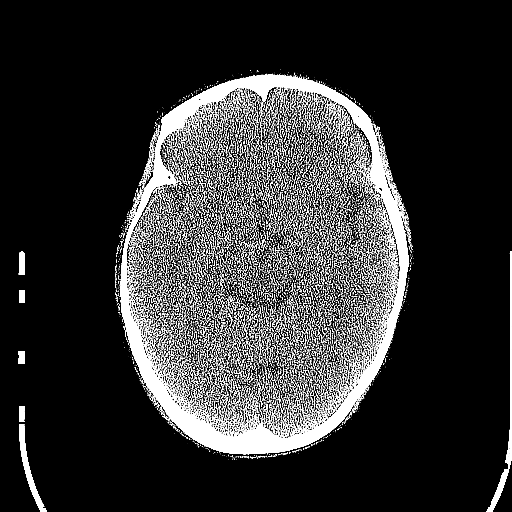
[im 29/73  brain]
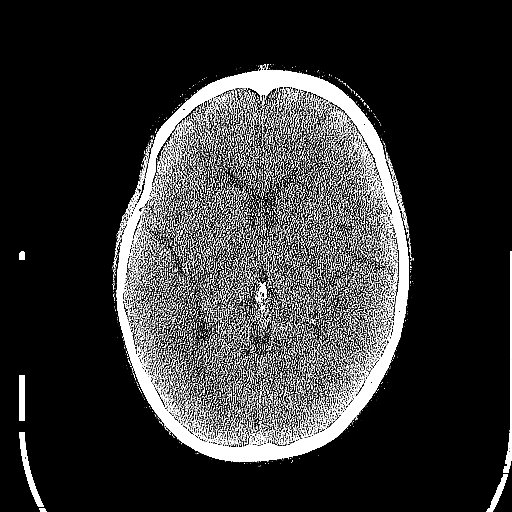
[im 37/73  brain]
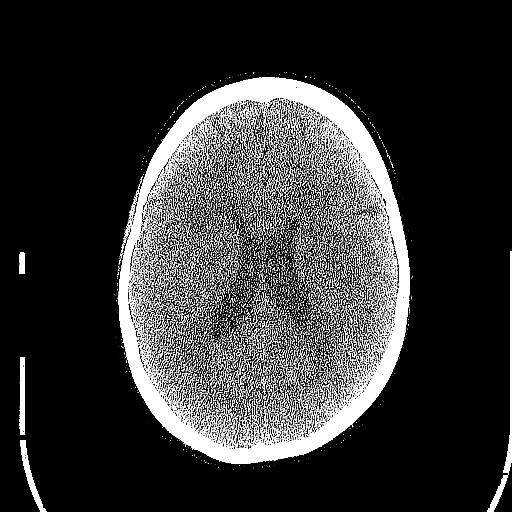
[im 37/73  bone]
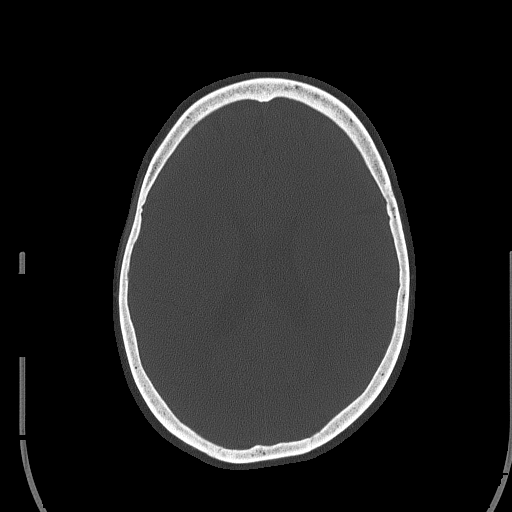
[im 44/73  brain]
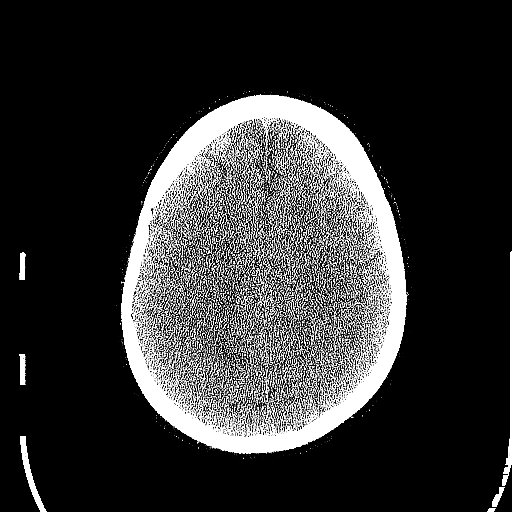
[im 51/73  brain]
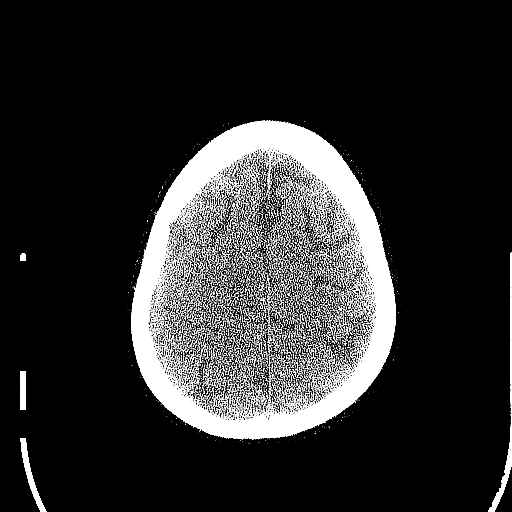
[im 58/73  brain]
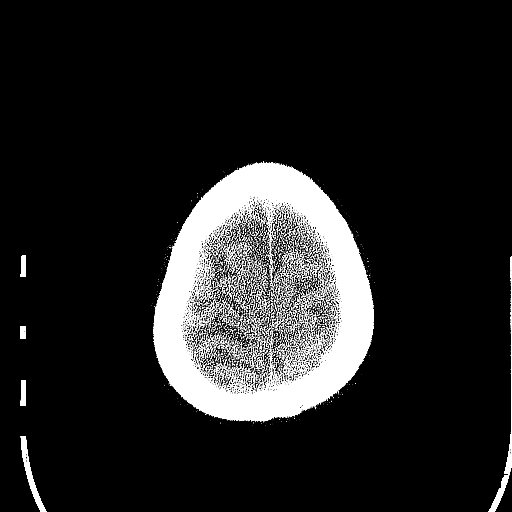
[im 65/73  brain]
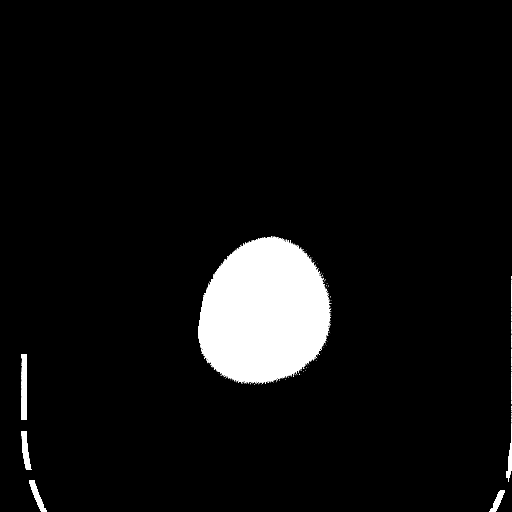
[im 65/73  bone]
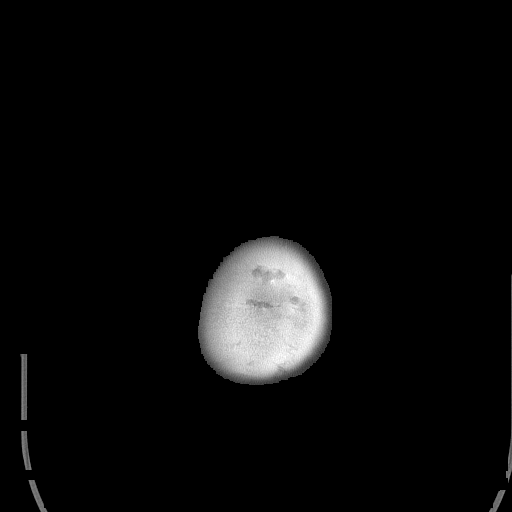

[Series 4: head 3.0 mpr cor · coronal · 0.28mm/px · 3 of 67 slices shown]
[im 23/67  brain]
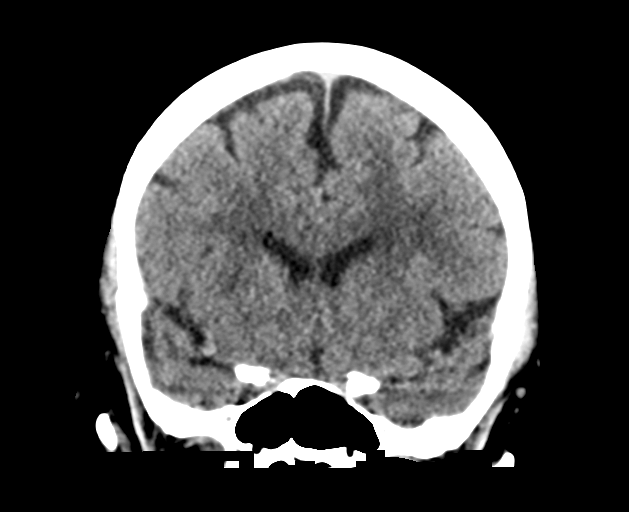
[im 30/67  brain]
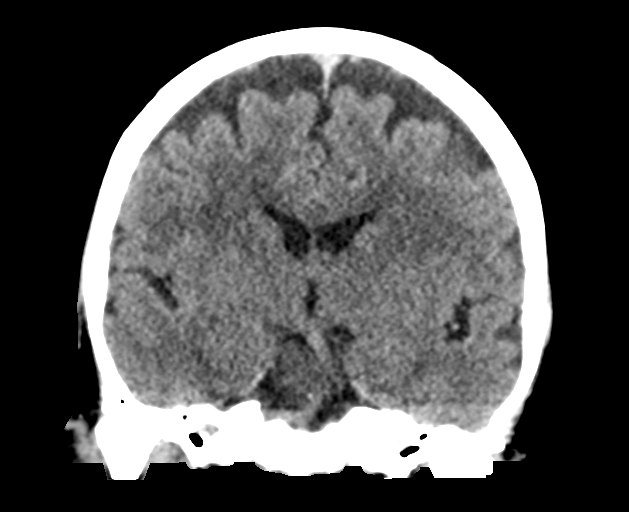
[im 37/67  brain]
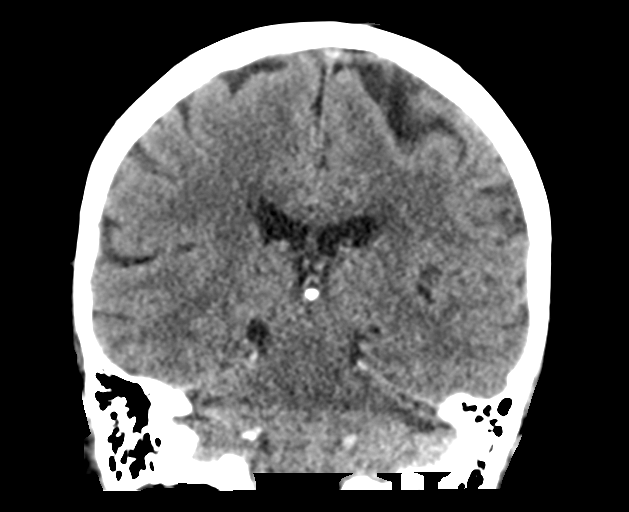

[Series 5: head 3.0 mpr sag · sagittal · 0.33mm/px · 3 of 66 slices shown]
[im 22/66  brain]
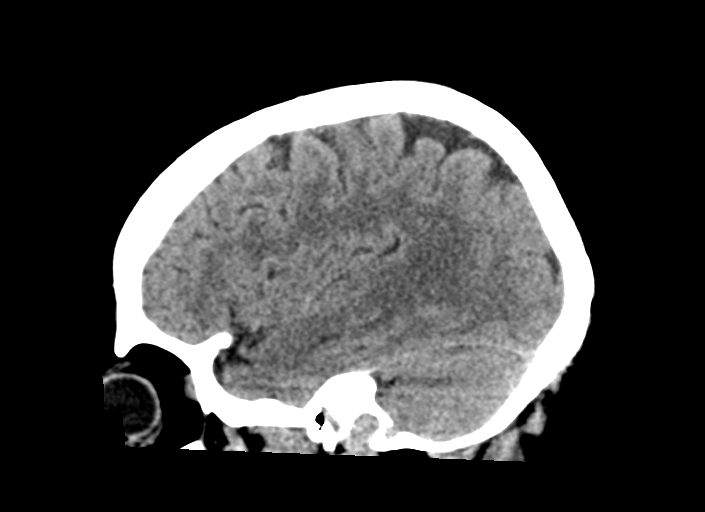
[im 33/66  brain]
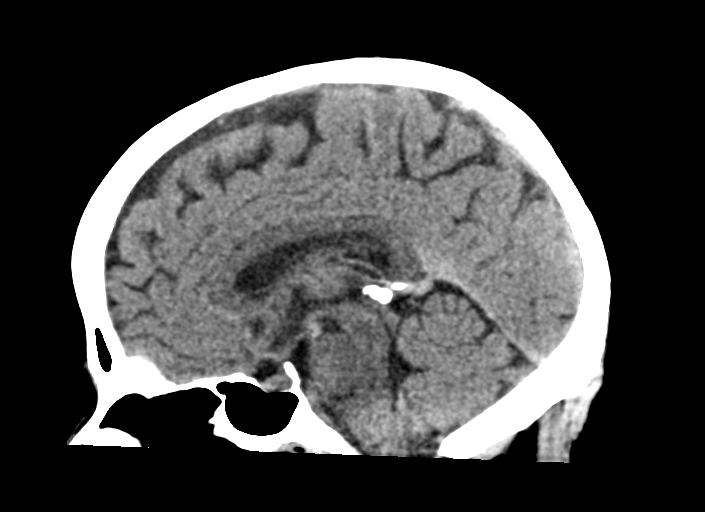
[im 44/66  brain]
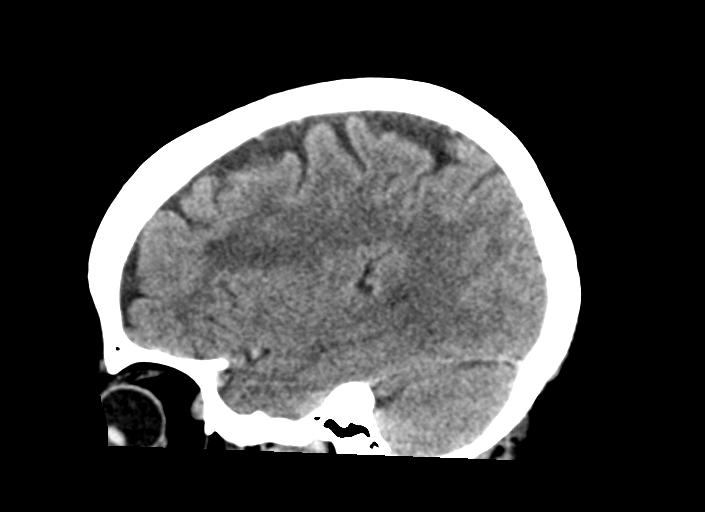

[15 of 47 positions shown; findings below may reference images not displayed]

FINDINGS: Brain: Minimal age-related atrophy. Normal ventricular morphology.
No midline shift or mass effect. Small vessel chronic ischemic
changes of deep cerebral white matter. No intracranial hemorrhage,
mass lesion, evidence of acute infarction, or extra-axial fluid
collection.

Vascular: Unremarkable

Skull: Intact

Sinuses/Orbits: Visualized paranasal sinuses and mastoid air cells
clear

Other: N/A
IMPRESSION: Small vessel chronic ischemic changes of deep cerebral white matter.

No acute intracranial abnormalities.

## 2018-10-28 ENCOUNTER — Other Ambulatory Visit (HOSPITAL_COMMUNITY): Payer: Self-pay | Admitting: *Deleted

## 2018-10-28 DIAGNOSIS — Z1231 Encounter for screening mammogram for malignant neoplasm of breast: Secondary | ICD-10-CM

## 2019-01-19 ENCOUNTER — Ambulatory Visit (HOSPITAL_COMMUNITY): Payer: Self-pay

## 2022-06-14 ENCOUNTER — Encounter (HOSPITAL_COMMUNITY): Payer: Self-pay

## 2022-06-14 ENCOUNTER — Emergency Department (HOSPITAL_COMMUNITY): Payer: Medicare Other

## 2022-06-14 ENCOUNTER — Other Ambulatory Visit: Payer: Self-pay

## 2022-06-14 ENCOUNTER — Emergency Department (HOSPITAL_COMMUNITY)
Admission: EM | Admit: 2022-06-14 | Discharge: 2022-06-14 | Disposition: A | Discharge status: Home / Self Care | Payer: Medicare Other | Attending: Emergency Medicine | Admitting: Emergency Medicine

## 2022-06-14 DIAGNOSIS — Z79899 Other long term (current) drug therapy: Secondary | ICD-10-CM | POA: Diagnosis not present

## 2022-06-14 DIAGNOSIS — I10 Essential (primary) hypertension: Secondary | ICD-10-CM | POA: Insufficient documentation

## 2022-06-14 DIAGNOSIS — I639 Cerebral infarction, unspecified: Secondary | ICD-10-CM | POA: Insufficient documentation

## 2022-06-14 DIAGNOSIS — F17210 Nicotine dependence, cigarettes, uncomplicated: Secondary | ICD-10-CM | POA: Diagnosis not present

## 2022-06-14 DIAGNOSIS — R079 Chest pain, unspecified: Secondary | ICD-10-CM | POA: Diagnosis present

## 2022-06-14 DIAGNOSIS — F1490 Cocaine use, unspecified, uncomplicated: Secondary | ICD-10-CM | POA: Diagnosis not present

## 2022-06-14 DIAGNOSIS — D72829 Elevated white blood cell count, unspecified: Secondary | ICD-10-CM | POA: Insufficient documentation

## 2022-06-14 DIAGNOSIS — R519 Headache, unspecified: Secondary | ICD-10-CM | POA: Diagnosis not present

## 2022-06-14 DIAGNOSIS — H538 Other visual disturbances: Secondary | ICD-10-CM | POA: Insufficient documentation

## 2022-06-14 LAB — CBC WITH DIFFERENTIAL/PLATELET
Abs Immature Granulocytes: 0.07 10*3/uL (ref 0.00–0.07)
Basophils Absolute: 0 10*3/uL (ref 0.0–0.1)
Basophils Relative: 0 %
Eosinophils Absolute: 0 10*3/uL (ref 0.0–0.5)
Eosinophils Relative: 0 %
HCT: 49 % — ABNORMAL HIGH (ref 36.0–46.0)
Hemoglobin: 16.5 g/dL — ABNORMAL HIGH (ref 12.0–15.0)
Immature Granulocytes: 1 %
Lymphocytes Relative: 25 %
Lymphs Abs: 2.9 10*3/uL (ref 0.7–4.0)
MCH: 30.4 pg (ref 26.0–34.0)
MCHC: 33.7 g/dL (ref 30.0–36.0)
MCV: 90.2 fL (ref 80.0–100.0)
Monocytes Absolute: 0.8 10*3/uL (ref 0.1–1.0)
Monocytes Relative: 7 %
Neutro Abs: 7.7 10*3/uL (ref 1.7–7.7)
Neutrophils Relative %: 67 %
Platelets: 295 10*3/uL (ref 150–400)
RBC: 5.43 MIL/uL — ABNORMAL HIGH (ref 3.87–5.11)
RDW: 13.3 % (ref 11.5–15.5)
WBC: 11.6 10*3/uL — ABNORMAL HIGH (ref 4.0–10.5)
nRBC: 0 % (ref 0.0–0.2)

## 2022-06-14 LAB — COMPREHENSIVE METABOLIC PANEL
ALT: 27 U/L (ref 0–44)
AST: 27 U/L (ref 15–41)
Albumin: 4.4 g/dL (ref 3.5–5.0)
Alkaline Phosphatase: 86 U/L (ref 38–126)
Anion gap: 9 (ref 5–15)
BUN: 13 mg/dL (ref 8–23)
CO2: 28 mmol/L (ref 22–32)
Calcium: 9.9 mg/dL (ref 8.9–10.3)
Chloride: 99 mmol/L (ref 98–111)
Creatinine, Ser: 0.67 mg/dL (ref 0.44–1.00)
GFR, Estimated: >60 mL/min (ref >60)
Glucose, Bld: 134 mg/dL — ABNORMAL HIGH (ref 70–99)
Potassium: 4.5 mmol/L (ref 3.5–5.1)
Sodium: 136 mmol/L (ref 135–145)
Total Bilirubin: 0.7 mg/dL (ref 0.3–1.2)
Total Protein: 9.2 g/dL — ABNORMAL HIGH (ref 6.5–8.1)

## 2022-06-14 LAB — RAPID URINE DRUG SCREEN, HOSP PERFORMED
Amphetamines: NOT DETECTED
Barbiturates: NOT DETECTED
Benzodiazepines: NOT DETECTED
Cocaine: POSITIVE — AB
Opiates: NOT DETECTED
Tetrahydrocannabinol: NOT DETECTED

## 2022-06-14 LAB — TROPONIN I (HIGH SENSITIVITY)
Troponin I (High Sensitivity): 16 ng/L (ref <18)
Troponin I (High Sensitivity): 15 ng/L (ref <18)

## 2022-06-14 MED ORDER — HYDROCHLOROTHIAZIDE 12.5 MG PO TABS
12.5000 mg | ORAL_TABLET | Freq: Once | ORAL | Status: AC
  Administered 2022-06-14: 12.5 mg via ORAL
  Filled 2022-06-14: qty 1

## 2022-06-14 MED ORDER — LISINOPRIL 10 MG PO TABS
40.0000 mg | ORAL_TABLET | Freq: Once | ORAL | Status: AC
  Administered 2022-06-14: 40 mg via ORAL
  Filled 2022-06-14: qty 4

## 2022-06-14 NOTE — ED Notes (Signed)
Trop re-draw and sent

## 2022-06-14 NOTE — ED Provider Notes (Signed)
[facility]-EMERGENCY DEPT Provider Note   CSN: [id number] Arrival date & time: 06/14/22  2355     History  Chief Complaint  Patient presents with   Cocaine Use    Selena Mclean is a 66 y.o. female.  HPI  Patient with medical history of tobacco use disorder, hypertension, hyperlipidemia, OSA, cocaine use disorder presents today due to headache and chest pain.  Patient states last night she was using cocaine and drinking alcohol.  Started having pain in her chest, it is left-sided and does not radiate elsewhere.  Its been intermittent, worse with coughing and positions.  States she is also having a headache that is above her eyes in a bandlike distribution.  States she is having intermittent blurry vision to both eyes, denies any lateralized weakness or numbness.  Denies any history of previous strokes or heart attacks, not on any anticoagulation.  No nausea or vomiting.  Home Medications Prior to Admission medications   Medication Sig Start Date End Date Taking? Authorizing Provider  hydrochlorothiazide (HYDRODIURIL) 25 MG tablet Take 1 tablet (25 mg total) by mouth daily. 11/22/16   Jacalyn Lefevre, MD  lisinopril (PRINIVIL,ZESTRIL) 40 MG tablet Take 1 tablet (40 mg total) by mouth daily. 11/22/16   Jacalyn Lefevre, MD      Allergies    Patient has no known allergies.    Review of Systems   Review of Systems  Physical Exam Updated Vital Signs BP (!) 171/112   Pulse 82   Temp 97.7 F (36.5 C) (Oral)   Resp 19   SpO2 94%  Physical Exam Vitals and nursing note reviewed. Exam conducted with a chaperone present.  Constitutional:      Appearance: Normal appearance.  HENT:     Head: Normocephalic and atraumatic.     Mouth/Throat:     Comments: Poor dentition Eyes:     General: No scleral icterus.       Right eye: No discharge.        Left eye: No discharge.     Extraocular Movements: Extraocular movements intact.     Pupils: Pupils are equal, round,  and reactive to light.     Comments: EOMI, no nystagmus  Neck:     Comments: No JVD, no carotid bruits Cardiovascular:     Rate and Rhythm: Normal rate and regular rhythm.     Pulses: Normal pulses.     Heart sounds: Normal heart sounds.     No friction rub. No gallop.     Comments: S1-S2, radial pulse, DP and PT 2+ symmetric bilaterally. Pulmonary:     Effort: Pulmonary effort is normal. No respiratory distress.     Breath sounds: Normal breath sounds.  Abdominal:     General: Abdomen is flat. Bowel sounds are normal. There is no distension.     Palpations: Abdomen is soft.     Tenderness: There is no abdominal tenderness.  Musculoskeletal:     Right lower leg: No edema.     Left lower leg: No edema.  Skin:    General: Skin is warm and dry.     Capillary Refill: Capillary refill takes less than 2 seconds.     Coloration: Skin is not jaundiced.  Neurological:     Mental Status: She is alert. Mental status is at baseline.     Coordination: Coordination normal.     Comments: Cranial nerves III through XII are grossly intact.  Grips and equal bilaterally, no pronator drift.  Normal finger-nose.  Able to raise both lower extremities, dorsiflexion and plantarflexion 5/5 bilaterally.     ED Results / Procedures / Treatments   Labs (all labs ordered are listed, but only abnormal results are displayed) Labs Reviewed  CBC WITH DIFFERENTIAL/PLATELET - Abnormal; Notable for the following components:      Result Value   WBC 11.6 (*)    RBC 5.43 (*)    Hemoglobin 16.5 (*)    HCT 49.0 (*)    All other components within normal limits  COMPREHENSIVE METABOLIC PANEL - Abnormal; Notable for the following components:   Glucose, Bld 134 (*)    Total Protein 9.2 (*)    All other components within normal limits  RAPID URINE DRUG SCREEN, HOSP PERFORMED - Abnormal; Notable for the following components:   Cocaine POSITIVE (*)    All other components within normal limits  TROPONIN I (HIGH  SENSITIVITY)  TROPONIN I (HIGH SENSITIVITY)    EKG EKG Interpretation  Date/Time:  Friday June 14 2022 06:15:53 EDT Ventricular Rate:  81 PR Interval:  209 QRS Duration: 89 QT Interval:  398 QTC Calculation: 462 R Axis:   -12 Text Interpretation: Sinus rhythm Left atrial enlargement Left ventricular hypertrophy Anterior infarct, old when compared to prior, similar appearance. No STEMI Confirmed by Theda Belfast ([id number]) on 06/14/2022 7:33:19 AM  Radiology CT HEAD WO CONTRAST ( )  Result Date: 06/14/2022 CLINICAL DATA:  66 year old female hypertensive emergency. Cocaine use 0100 hours. Headache and blurred vision. EXAM: CT HEAD WITHOUT CONTRAST TECHNIQUE: Contiguous axial images were obtained from the base of the skull through the vertex without intravenous contrast. RADIATION DOSE REDUCTION: This exam was performed according to the departmental dose-optimization program which includes automated exposure control, adjustment of the mA and/or kV according to patient size and/or use of iterative reconstruction technique. COMPARISON:  Head CT 11/22/2016. FINDINGS: Brain: New but age indeterminate hypodensity right lateral thalamus series 2, image 14. Cerebral volume is stable since 2018. No ventriculomegaly. No midline shift, mass effect, or evidence of intracranial mass lesion. Patchy and confluent bilateral cerebral white matter hypodensity is also chronic but progressed. No acute intracranial hemorrhage identified. No cortically based acute infarct identified. Vascular: Mild Calcified atherosclerosis at the skull base. No suspicious intracranial vascular hyperdensity. Skull: Negative. Sinuses/Orbits: Visualized paranasal sinuses and mastoids are stable and well aerated. Other: Visualized orbits and scalp soft tissues are within normal limits. IMPRESSION: 1. Age indeterminate right thalamic lacunar infarct, new since 2018. Query acute left side deficits. 2. Other chronic bilateral cerebral white  matter disease has also progressed since 2018. 3. No other acute intracranial abnormality. Electronically Signed   By: Odessa Fleming M.D.   On: 06/14/2022 07:12   DG Chest 2 View  Result Date: 06/14/2022 CLINICAL DATA:  66 year old female with history of chest pain and weakness after cocaine use. EXAM: CHEST - 2 VIEW COMPARISON:  Chest x-ray 11/22/2016. FINDINGS: Lung volumes are low. Mild diffuse peribronchial cuffing and interstitial prominence, most evident throughout the mid to lower lungs bilaterally. No consolidative airspace disease. No pleural effusions. No pneumothorax. No pulmonary nodule or mass noted. Pulmonary vasculature and the cardiomediastinal silhouette are within normal limits. Atherosclerotic calcifications in the thoracic aorta. IMPRESSION: 1. Findings are concerning for probable acute bronchitis, as above. 2. Aortic atherosclerosis. Electronically Signed   By: Trudie Reed M.D.   On: 06/14/2022 06:52    Procedures Procedures    Medications Ordered in ED Medications - No data to display  ED Course/ Medical Decision  Making/ A&P                           Medical Decision Making Amount and/or Complexity of Data Reviewed External Data Reviewed: radiology, ECG and notes.    Details: CT head from 2018, prior ECGs Labs:  Decision-making details documented in ED Course. Radiology: ordered. Decision-making details documented in ED Course. ECG/medicine tests:  Decision-making details documented in ED Course.  Risk Prescription drug management.   Patient presents with chest pain and headache.  Differential includes but not limited to ACS, dissection, PE, pneumonia, cocaine induced ischemia, pneumothorax, esophageal rupture, AAA, TIA, stroke, carotid dissection, SAH.  Patient is hypertensive at 171/112 on my initial examination.  No tachypnea or tachycardia, patient's lungs are clear to auscultation bilaterally.  No focal deficits on neuro exam, no left-sided deficits  specifically.  I ordered and viewed laboratory work-up.  Patient may be slightly hemoconcentrated on the CBC, there is a slight leukocytosis of 11.6 without any shift.  Hemoglobin is elevated at 16.5.  Platelet count normal.  CMP is without gross electrolyte derangement or AKI.  No transaminitis.  UDS notable for cocaine, negative for other substances.  Initial troponin 16, second troponin 15.   Patient is on cardiac monitoring and is in sinus rhythm per my interpretation when in the room.  I ordered EKG, per my interpretation patient is in sinus rhythm.  Compared to previous EKGs similar pattern.  No specific ischemic changes.  I ordered and viewed imaging. - Chest x-ray shows possible acute bronchitis.  Patient has history of the same, was previously on antibiotics which improved her symptoms.   -CT head without contrast shows age-indeterminate right thalamic infarct new since 2018.  Given headache with transient vision changes in the setting of new CT findings we will proceed with MR to evaluate for case of stroke. -MRI shows chronic right thalamic infarct that is chronic ischemic findings.  No acute findings.  Patient hypertensive, home BP medicines ordered.   Patient's second troponin is hemolyzed twice.  Third attempt at obtaining.  She denies any current chest pain, resting comfortably.  On cardiac monitoring in sinus rhythm with rate of 66.  Given flat troponins, no ischemic findings on EKG I do not think this is ACS.  Will patient follow-up with primary.  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  Patient is asymptomatic, states she cannot remember a time where she may have had left-sided weakness, headache or symptoms that be concerning for stroke or TIA.  I request I spoke with her significant other over the phone to discuss these findings. I will have her follow-up with neurology, ambulatory referral placed.   Discussed HPI, physical exam and plan of care for this patient with attending  Lynden Oxford. The attending physician evaluated this patient as part of a shared visit and agrees with plan of care.         Final Clinical Impression(s) / ED Diagnoses Final diagnoses:  None    Rx / DC Orders ED Discharge Orders     None         Theron Arista, [place] 06/14/22 1515    Tegeler, Canary Brim, MD 06/15/22 [phone]

## 2022-06-14 NOTE — Discharge Instructions (Addendum)
You are seen today in the emergency department for headache and chest pain.  The MRI of your head does show an old right thalamic infarct which could be a sign of a previous stroke or mini stroke.  You need to follow-up with neurology regarding this finding, they should call you on Monday to schedule an appointment. please schedule an appointment with [facility] neurology if you do not hear from them.  There were no signs of acute process that require admission.  Your cardiac labs were negative, follow-up with your primary.  If you have worsening chest pain, loss of consciousness, weakness on one side of your body, new or concerning symptoms return back to ED for evaluation.

## 2022-06-14 NOTE — ED Triage Notes (Addendum)
Patient BIB [facility] from home. She snorted some cocaine around 1am. She is worried something was mixed in with it. She said she "is not feeling well." Blurred vision, headache. History of hypertension.

## 2022-06-14 NOTE — ED Provider Triage Note (Signed)
Emergency Medicine Provider Triage Evaluation Note  Selena Mclean , a 66 y.o. female  was evaluated in triage.  Pt complains of feeling poorly after doing "a bunch of cocaine and alcohol".  She states she was with her friends when she used the substances and she subsequently went to feel poorly, lightheaded, severe frontal headache with blurry vision and chest discomfort.  Denies any other recreational drug use.  Review of Systems  Positive: Chest pain, lightheadedness, frontal headache, blurry vision Negative: Nausea vomiting diarrhea palpitations, shortness of breath  Physical Exam  BP (!) 220/99 (BP Location: Right Arm)   Pulse 82   Temp 97.7 F (36.5 C) (Oral)   Resp 19   SpO2 94%  Gen:   Awake, no distress   Resp:  Normal effort  MSK:   Moves extremities without difficulty  Other:  RRR no discharge 2.  Hypertensive, systolic 170s at this time.  Lungs CTA B.  PERRL, EOMI.  Patient appears intoxicated.    Medical Decision Making  Medically screening exam initiated at 6:25 AM.  Appropriate orders placed.  GLENISHA GUNDRY was informed that the remainder of the evaluation will be completed by another provider, this initial triage assessment does not replace that evaluation, and the importance of remaining in the ED until their evaluation is complete.  Workup initiated.   This chart was dictated using voice recognition software, Dragon. Despite the best efforts of this provider to proofread and correct errors, errors may still occur which can change documentation meaning.    Paris Lore, PA-C 06/14/22 [phone]

## 2022-07-04 ENCOUNTER — Ambulatory Visit (INDEPENDENT_AMBULATORY_CARE_PROVIDER_SITE_OTHER): Payer: Medicare Other | Admitting: Neurology

## 2022-07-04 ENCOUNTER — Encounter: Payer: Self-pay | Admitting: Neurology

## 2022-07-04 VITALS — BP 166/94 | HR 60 | Ht 62.0 in | Wt 200.0 lb

## 2022-07-04 DIAGNOSIS — I10 Essential (primary) hypertension: Secondary | ICD-10-CM | POA: Diagnosis not present

## 2022-07-04 DIAGNOSIS — I6381 Other cerebral infarction due to occlusion or stenosis of small artery: Secondary | ICD-10-CM | POA: Diagnosis not present

## 2022-07-04 NOTE — Patient Instructions (Signed)
Continue current medications  Start with Aspirin 81 mg daily  Carotid Ultrasound  Echocardiogram  I will contact you to go over the results  Return in a year

## 2022-07-04 NOTE — Progress Notes (Signed)
[facility]  PATIENT: Selena Mclean DOB: 1956/02/04  REQUESTING CLINICIAN: Theron Arista, PA-C HISTORY FROM: Patient  REASON FOR VISIT: Right thalamic stroke    HISTORICAL  CHIEF COMPLAINT:  Chief Complaint  Patient presents with   New Patient (Initial Visit)    Room 12, alone NP/internal ED referral for Chronic right thalmic infact on MRI Denies any new symptoms     HISTORY OF PRESENT ILLNESS:  This is a 66 year old woman past medical history of hypertension, hyperlipidemia, substance abuse who is presenting after being found to have a right thalamic stroke.  Patient reports on July 28, she was with some friends, doing alcohol and cocaine, and she felt chest pain and presented to the ED.  In the ED, she had a work-up including head CT that showed a chronic right thalamic stroke that is new since since 2017.  In 2017 she had a normal head CT.  Patient does not recall any time where she had onset of sudden left-sided weakness or numbness of denies fall.  Since leaving the hospital, she did follow-up with his PCP and was put on blood pressure medications and Lipitor.  She reports compliance with the medications.  She is not on aspirin.  OTHER MEDICAL CONDITIONS: Hypertension, Hyperlipidemia    REVIEW OF SYSTEMS: Full 14 system review of systems performed and negative with exception of: As noted in the HPI   ALLERGIES: No Known Allergies  HOME MEDICATIONS: Outpatient Medications Prior to Visit  Medication Sig Dispense Refill   atorvastatin (LIPITOR) 20 MG tablet Take 20 mg by mouth daily.     losartan-hydrochlorothiazide (HYZAAR) 100-25 MG tablet Take 1 tablet by mouth daily.     hydrochlorothiazide (HYDRODIURIL) 25 MG tablet Take 1 tablet (25 mg total) by mouth daily. 30 tablet 1   lisinopril (PRINIVIL,ZESTRIL) 40 MG tablet Take 1 tablet (40 mg total) by mouth daily. 30 tablet 1   No facility-administered medications prior to visit.    PAST MEDICAL  HISTORY: Past Medical History:  Diagnosis Date   Birthmarks, pigmented    right arm since birth   Fibrocystic breast    Hypercholesterolemia    hypercholesterolemia- diet [place]   Hypertension    Insomnia    Obesity    Obstructive sleep apnea    mild, no ask recommeded   Systolic murmur    innocent per pt., evaluateed years ago   Tobacco abuse    1/2 packper day    PAST SURGICAL HISTORY: Past Surgical History:  Procedure Laterality Date   TUBAL LIGATION  1984    FAMILY HISTORY: History reviewed. No pertinent family history.  SOCIAL HISTORY: Social History   Socioeconomic History   Marital status: Single    Spouse name: Not on file   Number of children: Not on file   Years of education: Not on file   Highest education level: Not on file  Occupational History   Not on file  Tobacco Use   Smoking status: Some Days    Packs/day: 0.50    Types: Cigarettes   Smokeless tobacco: Never  Substance and Sexual Activity   Alcohol use: Yes   Drug use: Yes    Types: Cocaine   Sexual activity: Not on file  Other Topics Concern   Not on file  Social History Narrative   Lost 2 brothers and fiance in 2009-10   Takes care of his mother and father who are both disabled. Lives with her mother.   1/2 ppd smoker  Occasional EtOH   No drug use.   Social Determinants of Health   Financial Resource Strain: Not on file  Food Insecurity: Not on file  Transportation Needs: Not on file  Physical Activity: Not on file  Stress: Not on file  Social Connections: Not on file  Intimate Partner Violence: Not on file    PHYSICAL EXAM  GENERAL EXAM/CONSTITUTIONAL: Vitals:  Vitals:   07/04/22 0940  BP: (!) 166/94  Pulse: 60  Weight: 200 lb (90.7 kg)  Height: 5\' 2"  (1.575 m)   Body mass index is 36.58 kg/m. Wt Readings from Last 3 Encounters:  07/04/22 200 lb (90.7 kg)  11/22/16 192 lb (87.1 kg)  08/21/16 192 lb 12.8 oz (87.5 kg)   Patient is in no distress; well developed,  nourished and groomed; neck is supple  EYES: Pupils round and reactive to light, Visual fields full to confrontation, Extraocular movements intacts,   MUSCULOSKELETAL: Gait, strength, tone, movements noted in Neurologic exam below  NEUROLOGIC: MENTAL STATUS:      No data to display         awake, alert, oriented to person, place and time recent and remote memory intact normal attention and concentration language fluent, comprehension intact, naming intact fund of knowledge appropriate  CRANIAL NERVE:  2nd, 3rd, 4th, 6th - pupils equal and reactive to light, visual fields full to confrontation, extraocular muscles intact, no nystagmus 5th - facial sensation symmetric 7th - facial strength symmetric 8th - hearing intact 9th - palate elevates symmetrically, uvula midline 11th - shoulder shrug symmetric 12th - tongue protrusion midline  MOTOR:  normal bulk and tone, full strength in the BUE, BLE  SENSORY:  normal and symmetric to light touch, pinprick, temperature, vibration  COORDINATION:  finger-nose-finger, fine finger movements normal  REFLEXES:  deep tendon reflexes present and symmetric  GAIT/STATION:  normal   DIAGNOSTIC DATA (LABS, IMAGING, TESTING) - I reviewed patient records, labs, notes, testing and imaging myself where available.  Lab Results  Component Value Date   WBC 11.6 (H) 06/14/2022   HGB 16.5 (H) 06/14/2022   HCT 49.0 (H) 06/14/2022   MCV 90.2 06/14/2022   PLT 295 06/14/2022      Component Value Date/Time   NA 136 06/14/2022 0620   K 4.5 06/14/2022 0620   CL 99 06/14/2022 0620   CO2 28 06/14/2022 0620   GLUCOSE 134 (H) 06/14/2022 0620   BUN 13 06/14/2022 0620   CREATININE 0.67 06/14/2022 0620   CREATININE 0.70 06/26/2011 1653   CALCIUM 9.9 06/14/2022 0620   PROT 9.2 (H) 06/14/2022 0620   ALBUMIN 4.4 06/14/2022 0620   AST 27 06/14/2022 0620   ALT 27 06/14/2022 0620   ALKPHOS 86 06/14/2022 0620   BILITOT 0.7 06/14/2022 0620    GFRNONAA >60 06/14/2022 0620   GFRNONAA >60 06/26/2011 1653   GFRAA >60 11/22/2016 0849   GFRAA >60 06/26/2011 1653   Lab Results  Component Value Date   CHOL 209 (H) 12/12/2009   HDL 76 12/12/2009   LDLCALC 119 (H) 12/12/2009   TRIG 68 12/12/2009   CHOLHDL 2.8 Ratio 12/12/2009   Lab Results  Component Value Date   HGBA1C 5.9 12/12/2009   No results found for: "VITAMINB12" No results found for: "TSH"  HgA1c 6.0 LDL 74  MRI Brain 06/14/22 Fairly advanced signal changes compatible with chronic small vessel disease but No acute intracranial abnormality. Chronic right thalamic infarct.    ASSESSMENT AND PLAN  66 y.o. year old female  with past medical history of hypertension and hyperlipidemia who was found to have a right thalamic stroke incidentally on evaluation. Patient stroke etiology likely small vessel disease.  She is already on hydrochlorothiazide, and lisinopril, I will add aspirin daily.  I will also complete the stroke work-up by getting ultrasound of the carotids and heart.  Her recent hemoglobin A1c was 6.0 and her LDL was 74.  I will contact her to go over the results otherwise I will see her in 1 year for follow-up.  She voices understanding.   1. Thalamic stroke (HCC)   2. Hypertension, unspecified type      Patient Instructions  Continue current medications  Start with Aspirin 81 mg daily  Carotid Ultrasound  Echocardiogram  I will contact you to go over the results  Return in a year   Orders Placed This Encounter  Procedures   US Carotid Bilateral   ECHOCARDIOGRAM COMPLETE    No orders of the defined types were placed in this encounter.   Return in about 1 year (around 07/05/2023).  I have spent a total of 50 minutes dedicated to this patient today, preparing to see patient, performing a medically appropriate examination and evaluation, ordering tests and/or medications and procedures, and counseling and educating the patient/family/caregiver;  independently interpreting result and communicating results to the family/patient/caregiver; and documenting clinical information in the electronic medical record.   Windell Norfolk, MD 07/04/2022, 4:48 PM  [facility] [address] [place], [place] [address] [phone]

## 2022-10-02 ENCOUNTER — Other Ambulatory Visit: Payer: Self-pay | Admitting: Family Medicine

## 2022-10-02 DIAGNOSIS — Z1231 Encounter for screening mammogram for malignant neoplasm of breast: Secondary | ICD-10-CM

## 2022-11-03 ENCOUNTER — Encounter (HOSPITAL_COMMUNITY): Payer: Self-pay | Admitting: Emergency Medicine

## 2022-11-03 ENCOUNTER — Other Ambulatory Visit: Payer: Self-pay

## 2022-11-03 ENCOUNTER — Ambulatory Visit (HOSPITAL_COMMUNITY)
Admission: EM | Admit: 2022-11-03 | Discharge: 2022-11-03 | Disposition: A | Discharge status: Home / Self Care | Payer: Medicare Other | Attending: Family Medicine | Admitting: Family Medicine

## 2022-11-03 DIAGNOSIS — N760 Acute vaginitis: Secondary | ICD-10-CM | POA: Diagnosis present

## 2022-11-03 MED ORDER — METRONIDAZOLE 500 MG PO TABS: 500.0000 mg | ORAL_TABLET | Freq: Two times a day (BID) | ORAL | 0 refills | Status: AC

## 2022-11-03 NOTE — ED Provider Notes (Signed)
[facility]    CSN: [id number] Arrival date & time: 11/03/22  1116      History   Chief Complaint Chief Complaint  Patient presents with   Abdominal Pain    HPI Selena Mclean is a 66 y.o. female.    Abdominal Pain  Here for vaginal discharge that is been going on for about 5 days.  She is maybe a little itchy.  Not much abdominal pain and no dysuria.  No fever or chills.  No vomiting. She states this is very much like when she had trichomonas previously.   Past Medical History:  Diagnosis Date   Birthmarks, pigmented    right arm since birth   Fibrocystic breast    Hypercholesterolemia    hypercholesterolemia- diet [place]   Hypertension    Insomnia    Obesity    Obstructive sleep apnea    mild, no ask recommeded   Systolic murmur    innocent per pt., evaluateed years ago   Tobacco abuse    1/2 packper day    Patient Active Problem List   Diagnosis Date Noted   SLEEP DISORDER 09/27/2008   HYPERCHOLESTEROLEMIA 11/06/2006   OBESITY 11/06/2006   TOBACCO ABUSE 11/06/2006   HYPERTENSION 11/06/2006   FIBROCYSTIC BREAST DISEASE 11/06/2006   INSOMNIA 11/06/2006   SLEEP APNEA 11/06/2006   SYSTOLIC MURMUR 11/06/2006    Past Surgical History:  Procedure Laterality Date   TUBAL LIGATION  1984    OB History   No obstetric history on file.      Home Medications    Prior to Admission medications   Medication Sig Start Date End Date Taking? Authorizing Provider  metroNIDAZOLE (FLAGYL) 500 MG tablet Take 1 tablet (500 mg total) by mouth 2 (two) times daily for 7 days. 11/03/22 11/10/22 Yes Zenia Resides, MD  atorvastatin (LIPITOR) 20 MG tablet Take 20 mg by mouth daily. 07/02/22   [provider]  losartan-hydrochlorothiazide (HYZAAR) 100-25 MG tablet Take 1 tablet by mouth daily. 07/02/22 07/02/23  [provider]    Family History History reviewed. No pertinent family history.  Social History Social History   Tobacco  Use   Smoking status: Some Days    Packs/day: 0.50    Types: Cigarettes   Smokeless tobacco: Never  Vaping Use   Vaping Use: Never used  Substance Use Topics   Alcohol use: Yes   Drug use: Yes    Types: Cocaine     Allergies   Patient has no known allergies.   Review of Systems Review of Systems  Gastrointestinal:  Positive for abdominal pain.     Physical Exam Triage Vital Signs ED Triage Vitals  Enc Vitals Group     BP 11/03/22 1303 135/69     Pulse Rate 11/03/22 1303 70     Resp 11/03/22 1303 20     Temp 11/03/22 1303 97.9 F (36.6 C)     Temp Source 11/03/22 1303 Oral     SpO2 11/03/22 1303 100 %     Weight --      Height --      Head Circumference --      Peak Flow --      Pain Score 11/03/22 1302 0     Pain Loc --      Pain Edu? --      Excl. in GC? --    No data found.  Updated Vital Signs BP 135/69 (BP Location: Right Arm) Comment (BP  Location): large cuff  Pulse 70   Temp 97.9 F (36.6 C) (Oral)   Resp 20   SpO2 100%   Visual Acuity Right Eye Distance:   Left Eye Distance:   Bilateral Distance:    Right Eye Near:   Left Eye Near:    Bilateral Near:     Physical Exam Vitals reviewed.  Constitutional:      General: She is not in acute distress.    Appearance: She is not ill-appearing, toxic-appearing or diaphoretic.  HENT:     Mouth/Throat:     Mouth: Mucous membranes are moist.  Eyes:     Extraocular Movements: Extraocular movements intact.     Conjunctiva/sclera: Conjunctivae normal.     Pupils: Pupils are equal, round, and reactive to light.  Cardiovascular:     Rate and Rhythm: Normal rate and regular rhythm.     Heart sounds: No murmur heard. Pulmonary:     Effort: Pulmonary effort is normal.     Breath sounds: Normal breath sounds.  Abdominal:     Palpations: Abdomen is soft.     Tenderness: There is no abdominal tenderness.  Musculoskeletal:     Cervical back: Neck supple.  Lymphadenopathy:     Cervical: No cervical  adenopathy.  Skin:    Coloration: Skin is not jaundiced or pale.  Neurological:     Mental Status: She is alert and oriented to person, place, and time.  Psychiatric:        Behavior: Behavior normal.      UC Treatments / Results  Labs (all labs ordered are listed, but only abnormal results are displayed) Labs Reviewed  CERVICOVAGINAL ANCILLARY ONLY    EKG   Radiology No results found.  Procedures Procedures (including critical care time)  Medications Ordered in UC Medications - No data to display  Initial Impression / Assessment and Plan / UC Course  I have reviewed the triage vital signs and the nursing notes.  Pertinent labs & imaging results that were available during my care of the patient were reviewed by me and considered in my medical decision making (see chart for details).        Swab is done and staff will notify her of any positives.  I am going to go ahead and treat empirically with metronidazole.  It is sent differently pharmacy, which is actually not open today.  She is aware of this and was still wishes it to go there, so that it can be delivered to her.  Also staff made me aware that she had a positive RPR in care everywhere.  This was a 1:4 concentration, and is stable from a year before.  She does have primary care follow-up Final Clinical Impressions(s) / UC Diagnoses   Final diagnoses:  Acute vaginitis     Discharge Instructions      Take metronidazole 500 mg--1 tablet 2 times daily for 7 days.  Avoid drinking alcohol within 72 hours of taking this medication  Staff will notify you if there is anything positive on the swab.  Keep your follow-up with your usual medical     ED Prescriptions     Medication Sig Dispense Auth. Provider   metroNIDAZOLE (FLAGYL) 500 MG tablet Take 1 tablet (500 mg total) by mouth 2 (two) times daily for 7 days. 14 tablet Christna Kulick, Janace Aris, MD      PDMP not reviewed this encounter.   Zenia Resides, MD 11/03/22 1322

## 2022-11-03 NOTE — Discharge Instructions (Signed)
Take metronidazole 500 mg--1 tablet 2 times daily for 7 days.  Avoid drinking alcohol within 72 hours of taking this medication  Staff will notify you if there is anything positive on the swab.  Keep your follow-up with your usual medical

## 2022-11-03 NOTE — ED Triage Notes (Signed)
Vaginal discharge started 4-5 days ago.  Intermittent stomach pain.  Patient has noticed an odor.

## 2022-11-04 LAB — CERVICOVAGINAL ANCILLARY ONLY
Bacterial Vaginitis (gardnerella): POSITIVE — AB
Candida Glabrata: NEGATIVE
Candida Vaginitis: NEGATIVE
Chlamydia: NEGATIVE
Comment: NEGATIVE
Comment: NEGATIVE
Comment: NEGATIVE
Comment: NEGATIVE
Comment: NEGATIVE
Comment: NORMAL
Neisseria Gonorrhea: NEGATIVE
Trichomonas: NEGATIVE

## 2022-11-27 ENCOUNTER — Ambulatory Visit: Payer: Medicare Other

## 2023-01-13 ENCOUNTER — Ambulatory Visit (HOSPITAL_COMMUNITY)
Admission: EM | Admit: 2023-01-13 | Discharge: 2023-01-13 | Disposition: A | Discharge status: Home / Self Care | Payer: 59 | Attending: Family Medicine | Admitting: Family Medicine

## 2023-01-13 ENCOUNTER — Encounter (HOSPITAL_COMMUNITY): Payer: Self-pay | Admitting: *Deleted

## 2023-01-13 DIAGNOSIS — N898 Other specified noninflammatory disorders of vagina: Secondary | ICD-10-CM | POA: Diagnosis present

## 2023-01-13 LAB — POCT URINALYSIS DIPSTICK, ED / UC
Bilirubin Urine: NEGATIVE
Glucose, UA: NEGATIVE mg/dL
Ketones, ur: NEGATIVE mg/dL
Nitrite: NEGATIVE
Protein, ur: NEGATIVE mg/dL
Specific Gravity, Urine: 1.025 (ref 1.005–1.030)
Urobilinogen, UA: 0.2 mg/dL (ref 0.0–1.0)
pH: 5 (ref 5.0–8.0)

## 2023-01-13 MED ORDER — METRONIDAZOLE 500 MG PO TABS
ORAL_TABLET | ORAL | Status: AC
  Filled 2023-01-13: qty 4

## 2023-01-13 MED ORDER — METRONIDAZOLE 500 MG PO TABS
2000.0000 mg | ORAL_TABLET | Freq: Once | ORAL | Status: AC
  Administered 2023-01-13: 2000 mg via ORAL

## 2023-01-13 MED ORDER — METRONIDAZOLE 500 MG PO TABS
ORAL_TABLET | ORAL | Status: AC
  Filled 2023-01-13: qty 1

## 2023-01-13 NOTE — Discharge Instructions (Addendum)
We have sent testing for various causes of vaginal infections. We will notify you of any positive results once they are received. If required, we will prescribe any medications you might need.  Please refrain from all sexual activity for at least the next seven days. You have been treated today for presumed Trichomonas with: Meds ordered this encounter  Medications   metroNIDAZOLE (FLAGYL) tablet 2,000 mg

## 2023-01-13 NOTE — ED Provider Notes (Signed)
[facility]   [id number] 01/13/23 Arrival Time: [id number]  ASSESSMENT & PLAN:  1. Vaginal discharge    Meds ordered this encounter  Medications   metroNIDAZOLE (FLAGYL) tablet 2,000 mg    Discharge Instructions      We have sent testing for various causes of vaginal infections. We will notify you of any positive results once they are received. If required, we will prescribe any medications you might need.  Please refrain from all sexual activity for at least the next seven days. You have been treated today for presumed Trichomonas.    Without s/s of PID.  Labs Reviewed  POCT URINALYSIS DIPSTICK, ED / UC - Abnormal; Notable for the following components:      Result Value   Hgb urine dipstick TRACE (*)    Leukocytes,Ua SMALL (*)    All other components within normal limits  CERVICOVAGINAL ANCILLARY ONLY   Hematuria on U/A; no s/s of bladder infection. May f/u with PCP for recheck.  Reviewed expectations re: course of current medical issues. Questions answered. Outlined signs and symptoms indicating need for more acute intervention. Patient verbalized understanding. After Visit Summary given.   SUBJECTIVE:  Selena Mclean is a 67 y.o. female who presents with complaint of vaginal discharge; x 3-4 days; h/o trich with similar symptoms. No odor.  No specific aggravating or alleviating factors reported. Denies: urinary frequency, dysuria, and gross hematuria. Afebrile. Denies abdominal or pelvic pain. Normal PO intake wihout n/v. No genital rashes or lesions. Reports that she is sexually active with one female partner.  No LMP recorded. Patient is postmenopausal.   OBJECTIVE:  Vitals:   01/13/23 1135  BP: 121/80  Pulse: 70  Resp: 20  Temp: (!) 97.2 F (36.2 C)  SpO2: 92%    General appearance: alert, cooperative, appears stated age and no distress Lungs: unlabored respirations; speaks full sentences without difficulty Back: no CVA tenderness; FROM at  waist Abdomen: soft, non-tender GU: deferred Skin: warm and dry Psychological: alert and cooperative; normal mood and affect.  Results for orders placed or performed during the hospital encounter of 01/13/23  POC Urinalysis dipstick  Result Value Ref Range   Glucose, UA NEGATIVE NEGATIVE mg/dL   Bilirubin Urine NEGATIVE NEGATIVE   Ketones, ur NEGATIVE NEGATIVE mg/dL   Specific Gravity, Urine 1.025 1.005 - 1.030   Hgb urine dipstick TRACE (A) NEGATIVE   pH 5.0 5.0 - 8.0   Protein, ur NEGATIVE NEGATIVE mg/dL   Urobilinogen, UA 0.2 0.0 - 1.0 mg/dL   Nitrite NEGATIVE NEGATIVE   Leukocytes,Ua SMALL (A) NEGATIVE    Labs Reviewed  POCT URINALYSIS DIPSTICK, ED / UC - Abnormal; Notable for the following components:      Result Value   Hgb urine dipstick TRACE (*)    Leukocytes,Ua SMALL (*)    All other components within normal limits  CERVICOVAGINAL ANCILLARY ONLY    No Known Allergies  Past Medical History:  Diagnosis Date   Birthmarks, pigmented    right arm since birth   Fibrocystic breast    Hypercholesterolemia    hypercholesterolemia- diet [place]   Hypertension    Insomnia    Obesity    Obstructive sleep apnea    mild, no ask recommeded   Systolic murmur    innocent per pt., evaluateed years ago   Tobacco abuse    1/2 packper day   History reviewed. No pertinent family history. Social History   Socioeconomic History   Marital status: Single  Spouse name: Not on file   Number of children: Not on file   Years of education: Not on file   Highest education level: Not on file  Occupational History   Not on file  Tobacco Use   Smoking status: Some Days    Packs/day: 0.50    Types: Cigarettes   Smokeless tobacco: Never  Vaping Use   Vaping Use: Never used  Substance and Sexual Activity   Alcohol use: Yes   Drug use: Yes    Types: Cocaine   Sexual activity: Not on file  Other Topics Concern   Not on file  Social History Narrative   Lost 2 brothers and  fiance in 2009-10   Takes care of his mother and father who are both disabled. Lives with her mother.   1/2 ppd smoker   Occasional EtOH   No drug use.   Social Determinants of Health   Financial Resource Strain: Not on file  Food Insecurity: Not on file  Transportation Needs: Not on file  Physical Activity: Not on file  Stress: Not on file  Social Connections: Not on file  Intimate Partner Violence: Not on file           Vanessa Kick, MD 01/13/23 1238

## 2023-01-13 NOTE — ED Triage Notes (Signed)
Pt reports vag discharge for 3-4 days.

## 2023-01-14 LAB — CERVICOVAGINAL ANCILLARY ONLY
Bacterial Vaginitis (gardnerella): NEGATIVE
Candida Glabrata: NEGATIVE
Candida Vaginitis: NEGATIVE
Chlamydia: NEGATIVE
Comment: NEGATIVE
Comment: NEGATIVE
Comment: NEGATIVE
Comment: NEGATIVE
Comment: NEGATIVE
Comment: NORMAL
Neisseria Gonorrhea: NEGATIVE
Trichomonas: POSITIVE — AB

## 2023-02-03 ENCOUNTER — Ambulatory Visit
Admission: RE | Admit: 2023-02-03 | Discharge: 2023-02-03 | Disposition: A | Discharge status: Home / Self Care | Payer: 59 | Source: Ambulatory Visit | Attending: Family Medicine | Admitting: Family Medicine

## 2023-02-03 DIAGNOSIS — Z1231 Encounter for screening mammogram for malignant neoplasm of breast: Secondary | ICD-10-CM

## 2023-02-10 ENCOUNTER — Other Ambulatory Visit: Payer: Self-pay | Admitting: Family Medicine

## 2023-02-10 DIAGNOSIS — Z13820 Encounter for screening for osteoporosis: Secondary | ICD-10-CM

## 2023-04-15 ENCOUNTER — Ambulatory Visit (HOSPITAL_COMMUNITY)
Admission: EM | Admit: 2023-04-15 | Discharge: 2023-04-15 | Disposition: A | Discharge status: Home / Self Care | Payer: 59 | Attending: Emergency Medicine | Admitting: Emergency Medicine

## 2023-04-15 ENCOUNTER — Encounter (HOSPITAL_COMMUNITY): Payer: Self-pay

## 2023-04-15 DIAGNOSIS — Z113 Encounter for screening for infections with a predominantly sexual mode of transmission: Secondary | ICD-10-CM

## 2023-04-15 DIAGNOSIS — F17210 Nicotine dependence, cigarettes, uncomplicated: Secondary | ICD-10-CM | POA: Diagnosis not present

## 2023-04-15 DIAGNOSIS — I10 Essential (primary) hypertension: Secondary | ICD-10-CM

## 2023-04-15 NOTE — ED Provider Notes (Signed)
[facility]    CSN: [id number] Arrival date & time: 04/15/23  0815      History   Chief Complaint Chief Complaint  Patient presents with   Exposure to STD    HPI NEKESHIA BRAMMER is a 67 y.o. female.  Here for STD testing Her partner advised her to be tested. She does not know if he had any positive results. Not having any symptoms. No vaginal discharge, bleeding, itching, rash, urinary symptoms, abdominal pain  At first mentioned dysuria but reports it was only once a few days ago and hasn't occurred again  Past Medical History:  Diagnosis Date   Birthmarks, pigmented    right arm since birth   Fibrocystic breast    Hypercholesterolemia    hypercholesterolemia- diet [place]   Hypertension    Insomnia    Obesity    Obstructive sleep apnea    mild, no ask recommeded   Systolic murmur    innocent per pt., evaluateed years ago   Tobacco abuse    1/2 packper day    Patient Active Problem List   Diagnosis Date Noted   SLEEP DISORDER 09/27/2008   HYPERCHOLESTEROLEMIA 11/06/2006   OBESITY 11/06/2006   TOBACCO ABUSE 11/06/2006   HYPERTENSION 11/06/2006   FIBROCYSTIC BREAST DISEASE 11/06/2006   INSOMNIA 11/06/2006   SLEEP APNEA 11/06/2006   SYSTOLIC MURMUR 11/06/2006    Past Surgical History:  Procedure Laterality Date   TUBAL LIGATION  1984    OB History   No obstetric history on file.      Home Medications    Prior to Admission medications   Medication Sig Start Date End Date Taking? Authorizing Provider  atorvastatin (LIPITOR) 20 MG tablet Take 20 mg by mouth daily. 07/02/22  Yes [provider]  losartan-hydrochlorothiazide (HYZAAR) 100-25 MG tablet Take 1 tablet by mouth daily. 07/02/22 07/02/23 Yes [provider]    Family History Family History  Problem Relation Age of Onset   Breast cancer Neg Hx     Social History Social History   Tobacco Use   Smoking status: Some Days    Packs/day: .5    Types: Cigarettes    Smokeless tobacco: Never  Vaping Use   Vaping Use: Never used  Substance Use Topics   Alcohol use: Yes   Drug use: Yes    Types: Cocaine     Allergies   Patient has no known allergies.   Review of Systems Review of Systems  As per HPI  Physical Exam Triage Vital Signs ED Triage Vitals [04/15/23 0949]  Enc Vitals Group     BP      Pulse      Resp      Temp      Temp src      SpO2      Weight      Height      Head Circumference      Peak Flow      Pain Score 0     Pain Loc      Pain Edu?      Excl. in GC?    No data found.  Updated Vital Signs BP (!) 190/98 (BP Location: Right Wrist)   Pulse (!) 55   Temp 98.1 F (36.7 C) (Oral)   Resp 18   SpO2 95%    Physical Exam Vitals and nursing note reviewed.  Constitutional:      General: She is not in acute distress.  Appearance: Normal appearance.  HENT:     Mouth/Throat:     Pharynx: Oropharynx is clear.  Cardiovascular:     Rate and Rhythm: Normal rate and regular rhythm.     Pulses: Normal pulses.     Heart sounds: Normal heart sounds.  Pulmonary:     Effort: Pulmonary effort is normal.     Breath sounds: Normal breath sounds.  Abdominal:     Palpations: Abdomen is soft. There is no mass.     Tenderness: There is no abdominal tenderness. There is no guarding.  Neurological:     Mental Status: She is alert and oriented to person, place, and time.     UC Treatments / Results  Labs (all labs ordered are listed, but only abnormal results are displayed) Labs Reviewed  CERVICOVAGINAL ANCILLARY ONLY    EKG  Radiology No results found.  Procedures Procedures  Medications Ordered in UC Medications - No data to display  Initial Impression / Assessment and Plan / UC Course  I have reviewed the triage vital signs and the nursing notes.  Pertinent labs & imaging results that were available during my care of the patient were reviewed by me and considered in my medical decision making (see  chart for details).  Cytology swab is pending. Will treat positive result if indicated  Hx HTN. Very elevated today, same on recheck.. She relates this to her partner mentioning need to be tested Denies headache, dizziness, vision changes, chest pain, shortness of breath, abdominal pain, weakness.  Takes hyzaar daily. Seen by PCP 2 months ago, BP was normal at that visit. Has follow up on 6/17.  Final Clinical Impressions(s) / UC Diagnoses   Final diagnoses:  Screen for STD (sexually transmitted disease)  Asymptomatic hypertension     Discharge Instructions      We will call you if anything on your swab returns positive. Please abstain from sexual intercourse until your results return.     ED Prescriptions   None    PDMP not reviewed this encounter.   Zyair Rhein, Lurena Joiner, [place] 04/15/23 1006

## 2023-04-15 NOTE — ED Triage Notes (Addendum)
Patient states her partner told her she needs to be tested for a STDs.  No discharge, rash, or odor. Not informed of any positive tests.   Patient states she does have slight burn with urination.

## 2023-04-15 NOTE — Discharge Instructions (Addendum)
We will call you if anything on your swab returns positive. Please abstain from sexual intercourse until your results return. 

## 2023-04-16 LAB — CERVICOVAGINAL ANCILLARY ONLY
Bacterial Vaginitis (gardnerella): NEGATIVE
Candida Glabrata: NEGATIVE
Candida Vaginitis: NEGATIVE
Chlamydia: NEGATIVE
Comment: NEGATIVE
Comment: NEGATIVE
Comment: NEGATIVE
Comment: NEGATIVE
Comment: NEGATIVE
Comment: NORMAL
Neisseria Gonorrhea: NEGATIVE
Trichomonas: NEGATIVE

## 2023-06-30 ENCOUNTER — Ambulatory Visit (INDEPENDENT_AMBULATORY_CARE_PROVIDER_SITE_OTHER): Payer: 59 | Admitting: Neurology

## 2023-06-30 ENCOUNTER — Encounter: Payer: Self-pay | Admitting: Neurology

## 2023-06-30 VITALS — BP 122/70 | HR 59 | Ht 62.0 in | Wt 194.0 lb

## 2023-06-30 DIAGNOSIS — I6381 Other cerebral infarction due to occlusion or stenosis of small artery: Secondary | ICD-10-CM | POA: Diagnosis not present

## 2023-06-30 NOTE — Patient Instructions (Signed)
Continue current medications including aspirin, Lipitor and Hyzaar Bilateral carotid ultrasound Echocardiogram I will contact you to go over the result Continue to follow with PCP and return as needed.

## 2023-06-30 NOTE — Progress Notes (Signed)
[facility]  PATIENT: Selena Mclean DOB: [birth year]  REQUESTING CLINICIAN: Massie Maroon, FNP HISTORY FROM: Patient  REASON FOR VISIT: Right thalamic stroke    HISTORICAL  CHIEF COMPLAINT:  Chief Complaint  Patient presents with   Follow-up    Rm 13, stroke f/u, no new symptoms,states she never heard from anyone about "tests that were going to be order"   INTERVAL HISTORY 06/30/2023:  Patient presents today for follow-up, last visit was a year ago and since then she has been doing fine, denies any new symptoms.  She did not complete her carotid ultrasound or echocardiogram.  Again no other complaints no other concerns.  She is compliant with her medications.   HISTORY OF PRESENT ILLNESS:  This is a 67 year old woman past medical history of hypertension, hyperlipidemia, substance abuse who is presenting after being found to have a right thalamic stroke.  Patient reports on July 28, she was with some friends, doing alcohol and cocaine, and she felt chest pain and presented to the ED.  In the ED, she had a work-up including head CT that showed a chronic right thalamic stroke that is new since since 2017.  In 2017 she had a normal head CT.  Patient does not recall any time where she had onset of sudden left-sided weakness or numbness of denies fall.  Since leaving the hospital, she did follow-up with his PCP and was put on blood pressure medications and Lipitor.  She reports compliance with the medications.  She is not on aspirin.  OTHER MEDICAL CONDITIONS: Hypertension, Hyperlipidemia    REVIEW OF SYSTEMS: Full 14 system review of systems performed and negative with exception of: As noted in the HPI   ALLERGIES: No Known Allergies  HOME MEDICATIONS: Outpatient Medications Prior to Visit  Medication Sig Dispense Refill   atorvastatin (LIPITOR) 20 MG tablet Take 20 mg by mouth daily.     losartan-hydrochlorothiazide (HYZAAR) 100-25 MG tablet Take 1 tablet  by mouth daily.     metFORMIN (GLUCOPHAGE) 500 MG tablet Take 500 mg by mouth 2 (two) times daily with a meal.     No facility-administered medications prior to visit.    PAST MEDICAL HISTORY: Past Medical History:  Diagnosis Date   Birthmarks, pigmented    right arm since birth   Fibrocystic breast    Hypercholesterolemia    hypercholesterolemia- diet [place]   Hypertension    Insomnia    Obesity    Obstructive sleep apnea    mild, no ask recommeded   Systolic murmur    innocent per pt., evaluateed years ago   Tobacco abuse    1/2 packper day    PAST SURGICAL HISTORY: Past Surgical History:  Procedure Laterality Date   TUBAL LIGATION  1984    FAMILY HISTORY: Family History  Problem Relation Age of Onset   Breast cancer Neg Hx     SOCIAL HISTORY: Social History   Socioeconomic History   Marital status: Single    Spouse name: Not on file   Number of children: Not on file   Years of education: Not on file   Highest education level: Not on file  Occupational History   Not on file  Tobacco Use   Smoking status: Some Days    Current packs/day: 0.50    Types: Cigarettes   Smokeless tobacco: Never  Vaping Use   Vaping status: Never Used  Substance and Sexual Activity   Alcohol use: Yes   Drug use:  Yes    Types: Cocaine   Sexual activity: Not on file  Other Topics Concern   Not on file  Social History Narrative   Lost 2 brothers and fiance in 2009-10   Takes care of his mother and father who are both disabled. Lives with her mother.   1/2 ppd smoker   Occasional EtOH   No drug use.   Social Determinants of Health   Financial Resource Strain: Not at Risk (02/10/2023)   Received from [place], [place]   [employer]    Financial Resource Strain: 1  Food Insecurity: Not at Risk (02/10/2023)   Received from [place], [place]   Food Insecurity    Food: 1  Transportation Needs: Not at Risk (02/10/2023)   Received from [place], [employer] Needs     Transportation: 1  Physical Activity: Not on File (03/07/2022)   Received from [place], [place]   Physical Activity    Physical Activity: 0  Stress: Not on File (03/07/2022)   Received from [facility], [place]   Stress    Stress: 0  Social Connections: Not on File (03/07/2022)   Received from [place], [place]   Social Connections    Social Connections and Isolation: 0  Intimate Partner Violence: Not on file    PHYSICAL EXAM  GENERAL EXAM/CONSTITUTIONAL: Vitals:  Vitals:   06/30/23 0846  BP: 122/70  Pulse: (!) 59  Weight: 194 lb (88 kg)  Height: 5\' 2"  (1.575 m)   Body mass index is 35.48 kg/m. Wt Readings from Last 3 Encounters:  06/30/23 194 lb (88 kg)  07/04/22 200 lb (90.7 kg)  11/22/16 192 lb (87.1 kg)   Patient is in no distress; well developed, nourished and groomed; neck is supple  MUSCULOSKELETAL: Gait, strength, tone, movements noted in Neurologic exam below  NEUROLOGIC: MENTAL STATUS:      No data to display         awake, alert, oriented to person, place and time recent and remote memory intact normal attention and concentration language fluent, comprehension intact, naming intact fund of knowledge appropriate  CRANIAL NERVE:  2nd, 3rd, 4th, 6th - visual fields full to confrontation, extraocular muscles intact, no nystagmus 5th - facial sensation symmetric 7th - facial strength symmetric 8th - hearing intact 9th - palate elevates symmetrically, uvula midline 11th - shoulder shrug symmetric 12th - tongue protrusion midline  MOTOR:  normal bulk and tone, full strength in the BUE, BLE  SENSORY:  normal and symmetric to light touch  COORDINATION:  finger-nose-finger, fine finger movements normal  GAIT/STATION:  normal   DIAGNOSTIC DATA (LABS, IMAGING, TESTING) - I reviewed patient records, labs, notes, testing and imaging myself where available.  Lab Results  Component Value Date   WBC 11.6 (H) 06/14/2022   HGB 16.5 (H) 06/14/2022   HCT 49.0  (H) 06/14/2022   MCV 90.2 06/14/2022   PLT 295 06/14/2022      Component Value Date/Time   NA 136 06/14/2022 0620   K 4.5 06/14/2022 0620   CL 99 06/14/2022 0620   CO2 28 06/14/2022 0620   GLUCOSE 134 (H) 06/14/2022 0620   BUN 13 06/14/2022 0620   CREATININE 0.67 06/14/2022 0620   CREATININE 0.70 06/26/2011 1653   CALCIUM 9.9 06/14/2022 0620   PROT 9.2 (H) 06/14/2022 0620   ALBUMIN 4.4 06/14/2022 0620   AST 27 06/14/2022 0620   ALT 27 06/14/2022 0620   ALKPHOS 86 06/14/2022 0620   BILITOT 0.7 06/14/2022 1610  GFRNONAA >60 06/14/2022 0620   GFRNONAA >60 06/26/2011 1653   GFRAA >60 11/22/2016 0849   GFRAA >60 06/26/2011 1653   Lab Results  Component Value Date   CHOL 209 (H) 12/12/2009   HDL 76 12/12/2009   LDLCALC 119 (H) 12/12/2009   TRIG 68 12/12/2009   CHOLHDL 2.8 Ratio 12/12/2009   Lab Results  Component Value Date   HGBA1C 5.9 12/12/2009   No results found for: "VITAMINB12" No results found for: "TSH"  HgA1c 6.0 LDL 74  MRI Brain 06/14/22 Fairly advanced signal changes compatible with chronic small vessel disease but No acute intracranial abnormality. Chronic right thalamic infarct.    ASSESSMENT AND PLAN  67 y.o. year old female with past medical history of hypertension and hyperlipidemia who presenting for follow-up after right thalamic stroke.  Stroke etiology likely small vessel disease.  She is already on Lipitor, Hyzaar and aspirin.  Plan is to continue current medications and to obtain echocardiogram and carotid ultrasound.  I will contact the patient to go over the result.  If within normal limits, she can continue to follow-up with PCP.  Return as needed for any other concerns.   1. Thalamic stroke [facility])      Patient Instructions  Continue current medications including aspirin, Lipitor and Hyzaar Bilateral carotid ultrasound Echocardiogram I will contact you to go over the result Continue to follow with PCP and return as needed.  Orders  Placed This Encounter  Procedures   ECHOCARDIOGRAM COMPLETE   VAS US CAROTID    No orders of the defined types were placed in this encounter.   Return if symptoms worsen or fail to improve.    Windell Norfolk, MD 06/30/2023, 9:31 AM  [facility] [address] [place], [place] [address] [phone]

## 2023-07-31 ENCOUNTER — Telehealth: Payer: Self-pay | Admitting: Neurology

## 2023-07-31 NOTE — Telephone Encounter (Signed)
Spoke with Selena Mclean, patient should call [phone]. Called patient and let her know this, she is going to call them to schedule these tests.

## 2023-07-31 NOTE — Telephone Encounter (Signed)
Patient called in stated she has not received a call to schedule Bilateral carotid US and echocardiogram. I see orders put in with notes no auth needed. But unsure where patient should call to get these scheduled. I updated patients phone number. Please advise

## 2024-01-22 ENCOUNTER — Ambulatory Visit (HOSPITAL_COMMUNITY)
Admission: EM | Admit: 2024-01-22 | Discharge: 2024-01-22 | Disposition: A | Discharge status: Home / Self Care | Attending: Family Medicine | Admitting: Family Medicine

## 2024-01-22 ENCOUNTER — Encounter (HOSPITAL_COMMUNITY): Payer: Self-pay

## 2024-01-22 DIAGNOSIS — R300 Dysuria: Secondary | ICD-10-CM | POA: Diagnosis not present

## 2024-01-22 DIAGNOSIS — N760 Acute vaginitis: Secondary | ICD-10-CM | POA: Insufficient documentation

## 2024-01-22 LAB — POCT URINALYSIS DIP (MANUAL ENTRY)
Bilirubin, UA: NEGATIVE
Glucose, UA: NEGATIVE mg/dL
Ketones, POC UA: NEGATIVE mg/dL
Nitrite, UA: NEGATIVE
Spec Grav, UA: 1.025 (ref 1.010–1.025)
Urobilinogen, UA: 1 U/dL
pH, UA: 6 (ref 5.0–8.0)

## 2024-01-22 MED ORDER — METRONIDAZOLE 500 MG PO TABS
ORAL_TABLET | ORAL | Status: AC
  Filled 2024-01-22: qty 4

## 2024-01-22 MED ORDER — METRONIDAZOLE 500 MG PO TABS
2000.0000 mg | ORAL_TABLET | Freq: Once | ORAL | Status: AC
  Administered 2024-01-22: 2000 mg via ORAL

## 2024-01-22 NOTE — ED Provider Notes (Signed)
 [facility]    CSN: [id number] Arrival date & time: 01/22/24  4098      History   Chief Complaint Chief Complaint  Patient presents with   Vaginal Discharge    HPI Selena Mclean is a 68 y.o. female.    Vaginal Discharge Here for vaginal discharge is been going on over a week.  In the last few days she has had dysuria and odor to her vaginal secretions.  Is not really any urinary frequency.  No fever or vomiting.  She does have some pressure in her pelvis but no actual cramps  .  NKDA  She states this is very much like when she has had trichomonas in the past.  In 2024 she was prescribed 4 pills once in the office and that did seem to treat the infection, as a vaginal swab done about 3 months later was negative.  She states it would be difficult for her to get to the pharmacy  Past Medical History:  Diagnosis Date   Birthmarks, pigmented    right arm since birth   Fibrocystic breast    Hypercholesterolemia    hypercholesterolemia- diet [place]   Hypertension    Insomnia    Obesity    Obstructive sleep apnea    mild, no ask recommeded   Systolic murmur    innocent per pt., evaluateed years ago   Tobacco abuse    1/2 packper day    Patient Active Problem List   Diagnosis Date Noted   SLEEP DISORDER 09/27/2008   HYPERCHOLESTEROLEMIA 11/06/2006   OBESITY 11/06/2006   TOBACCO ABUSE 11/06/2006   HYPERTENSION 11/06/2006   FIBROCYSTIC BREAST DISEASE 11/06/2006   INSOMNIA 11/06/2006   SLEEP APNEA 11/06/2006   SYSTOLIC MURMUR 11/06/2006    Past Surgical History:  Procedure Laterality Date   TUBAL LIGATION  1984    OB History   No obstetric history on file.      Home Medications    Prior to Admission medications   Medication Sig Start Date End Date Taking? Authorizing Provider  atorvastatin (LIPITOR) 20 MG tablet Take 20 mg by mouth daily. 07/02/22  Yes [provider]  losartan-hydrochlorothiazide (HYZAAR) 100-25 MG tablet Take 1 tablet  by mouth daily. 07/02/22 01/22/24 Yes [provider]  metFORMIN (GLUCOPHAGE) 500 MG tablet Take 500 mg by mouth 2 (two) times daily with a meal. 02/10/23  Yes [provider]    Family History Family History  Problem Relation Age of Onset   Breast cancer Neg Hx     Social History Social History   Tobacco Use   Smoking status: Some Days    Current packs/day: 0.50    Types: Cigarettes   Smokeless tobacco: Never  Vaping Use   Vaping status: Never Used  Substance Use Topics   Alcohol use: Yes   Drug use: Yes    Types: Cocaine     Allergies   Patient has no known allergies.   Review of Systems Review of Systems  Genitourinary:  Positive for vaginal discharge.     Physical Exam Triage Vital Signs ED Triage Vitals  Encounter Vitals Group     BP 01/22/24 1126 (!) 160/79     Systolic BP Percentile --      Diastolic BP Percentile --      Pulse Rate 01/22/24 1126 62     Resp 01/22/24 1126 18     Temp 01/22/24 1126 98.3 F (36.8 C)     Temp  Source 01/22/24 1126 Oral     SpO2 01/22/24 1126 95 %     Weight 01/22/24 1126 194 lb 0.1 oz (88 kg)     Height 01/22/24 1126 5\' 2"  (1.575 m)     Head Circumference --      Peak Flow --      Pain Score 01/22/24 1125 0     Pain Loc --      Pain Education --      Exclude from Growth Chart --    No data found.  Updated Vital Signs BP (!) 160/79 (BP Location: Right Arm)   Pulse 62   Temp 98.3 F (36.8 C) (Oral)   Resp 18   Ht 5\' 2"  (1.575 m)   Wt 88 kg   SpO2 95%   BMI 35.48 kg/m   Visual Acuity Right Eye Distance:   Left Eye Distance:   Bilateral Distance:    Right Eye Near:   Left Eye Near:    Bilateral Near:     Physical Exam Vitals reviewed.  Constitutional:      General: She is not in acute distress.    Appearance: She is not toxic-appearing.  HENT:     Mouth/Throat:     Mouth: Mucous membranes are moist.  Cardiovascular:     Rate and Rhythm: Normal rate and regular rhythm.  Pulmonary:      Effort: Pulmonary effort is normal.     Breath sounds: Normal breath sounds.  Abdominal:     Palpations: Abdomen is soft.     Tenderness: There is no abdominal tenderness.  Skin:    Coloration: Skin is not pale.  Neurological:     General: No focal deficit present.     Mental Status: She is alert and oriented to person, place, and time.  Psychiatric:        Behavior: Behavior normal.      UC Treatments / Results  Labs (all labs ordered are listed, but only abnormal results are displayed) Labs Reviewed  POCT URINALYSIS DIP (MANUAL ENTRY) - Abnormal; Notable for the following components:      Result Value   Color, UA straw (*)    Clarity, UA hazy (*)    Blood, UA small (*)    Protein Ur, POC trace (*)    Leukocytes, UA Small (1+) (*)    All other components within normal limits  URINE CULTURE  CERVICOVAGINAL ANCILLARY ONLY    EKG   Radiology No results found.  Procedures Procedures (including critical care time)  Medications Ordered in UC Medications - No data to display  Initial Impression / Assessment and Plan / UC Course  I have reviewed the triage vital signs and the nursing notes.  Pertinent labs & imaging results that were available during my care of the patient were reviewed by me and considered in my medical decision making (see chart for details).     Vaginal self swab is done, and we will notify of any positives on that and treat per protocol. She really wants to do the 4 tablet treatment here for trichomonas, so Flagyl is ordered and she will receive that before discharge  She declines my offer of HIV and RPR testing on blood work.  Her urinalysis does show some white cells and red cells; though this could be from her vaginal discharge, urine culture is sent.  We will notify her if it looks like she needs treatment for UTI. Final Clinical Impressions(s) / UC Diagnoses  Final diagnoses:  None   Discharge Instructions   None    ED  Prescriptions   None    PDMP not reviewed this encounter.   Zenia Resides, MD 01/22/24 [phone]

## 2024-01-22 NOTE — Discharge Instructions (Addendum)
 The urinalysis had some white blood cells and red blood cells; this could be from the vaginal discharge.  Urine culture is sent, and staff will notify you that it looks like you need an antibiotic.  Staff will notify you if there is anything positive on the swab.

## 2024-01-22 NOTE — ED Triage Notes (Addendum)
 Patient presenting with vaginal odor and discharge with burning urination onset 1 week ago. States the last time she had these symptoms she was diagnosed with a STD and was given 4 pills in office to take.  Prescriptions or OTC medications tried: No

## 2024-01-23 LAB — CERVICOVAGINAL ANCILLARY ONLY
Bacterial Vaginitis (gardnerella): POSITIVE — AB
Candida Glabrata: NEGATIVE
Candida Vaginitis: POSITIVE — AB
Chlamydia: NEGATIVE
Comment: NEGATIVE
Comment: NEGATIVE
Comment: NEGATIVE
Comment: NEGATIVE
Comment: NEGATIVE
Comment: NORMAL
Neisseria Gonorrhea: NEGATIVE
Trichomonas: POSITIVE — AB

## 2024-01-23 LAB — URINE CULTURE: Culture: 10000 — AB

## 2024-01-27 ENCOUNTER — Telehealth (HOSPITAL_COMMUNITY): Payer: Self-pay

## 2024-01-27 MED ORDER — FLUCONAZOLE 150 MG PO TABS: 150.0000 mg | ORAL_TABLET | Freq: Once | ORAL | 0 refills | Status: AC

## 2024-01-27 MED ORDER — METRONIDAZOLE 500 MG PO TABS: 500.0000 mg | ORAL_TABLET | Freq: Two times a day (BID) | ORAL | 0 refills | Status: AC

## 2024-01-27 NOTE — Telephone Encounter (Signed)
 Per protocol, pt requires tx with metronidazole and Diflucan. Attempted to reach patient x2. Unable to LVM. Rx sent to pharmacy on file.

## 2024-02-06 ENCOUNTER — Telehealth: Payer: Self-pay

## 2024-02-06 NOTE — Telephone Encounter (Signed)
 Pt received letter regarding test results. Pt made aware of results and meds that had been sent to pharmacy. Questions answered. Verbalized understanding.

## 2024-03-24 ENCOUNTER — Other Ambulatory Visit: Payer: Self-pay | Admitting: Nurse Practitioner

## 2024-03-24 DIAGNOSIS — B182 Chronic viral hepatitis C: Secondary | ICD-10-CM

## 2024-03-24 DIAGNOSIS — F1010 Alcohol abuse, uncomplicated: Secondary | ICD-10-CM

## 2024-03-24 DIAGNOSIS — F1410 Cocaine abuse, uncomplicated: Secondary | ICD-10-CM

## 2024-04-07 ENCOUNTER — Other Ambulatory Visit

## 2024-04-08 ENCOUNTER — Ambulatory Visit (HOSPITAL_COMMUNITY)
Admission: EM | Admit: 2024-04-08 | Discharge: 2024-04-08 | Disposition: A | Discharge status: Home / Self Care | Attending: Family Medicine | Admitting: Family Medicine

## 2024-04-08 ENCOUNTER — Encounter (HOSPITAL_COMMUNITY): Payer: Self-pay

## 2024-04-08 DIAGNOSIS — N898 Other specified noninflammatory disorders of vagina: Secondary | ICD-10-CM | POA: Insufficient documentation

## 2024-04-08 LAB — POCT URINALYSIS DIP (MANUAL ENTRY)
Bilirubin, UA: NEGATIVE
Glucose, UA: NEGATIVE mg/dL
Ketones, POC UA: NEGATIVE mg/dL
Nitrite, UA: NEGATIVE
Protein Ur, POC: NEGATIVE mg/dL
Spec Grav, UA: 1.02 (ref 1.010–1.025)
Urobilinogen, UA: 1 U/dL
pH, UA: 5.5 (ref 5.0–8.0)

## 2024-04-08 MED ORDER — METRONIDAZOLE 500 MG PO TABS: 500.0000 mg | ORAL_TABLET | Freq: Two times a day (BID) | ORAL | 0 refills | Status: AC

## 2024-04-08 NOTE — ED Triage Notes (Signed)
 Patient here today with c/o lower abd pain and vaginal discharge X 2-3 days.

## 2024-04-08 NOTE — Discharge Instructions (Signed)
 We will notify you of the swab results and if there is need to take further medications. I highly recommend avoiding douching for hygiene as this may be causing recurrent infections If you have fevers, chills, increasing abdominal pain, or worsening of your symptoms return. Make sure you take all the medication until it is gone.

## 2024-04-08 NOTE — ED Provider Notes (Signed)
 [facility]    CSN: [id number] Arrival date & time: 04/08/24  0801      History   Chief Complaint Chief Complaint  Patient presents with   Abdominal Pain    HPI Selena Mclean is a 68 y.o. female.   The patient presents with 3 days of vaginal discharge that is thick and malodorous.  She also has some discomfort lower abdomen pelvis only when urinating. She has had the same sexual partner for years and believes he was treated recently for any infections that may have caused her previous infection. She does deuce intermittently for hygiene but has decreased in frequency compared to how often she used to. After her last visit for vaginal discharge her symptoms cleared and she did not have any for weeks. She denies any headaches, vision changes, chest pain, palpitations, shortness of breath, confusion, fevers, chills, nausea, vomiting, diarrhea, constipation, flank pain, blood in the urine, or from the vagina.   The history is provided by the patient.  Abdominal Pain Associated symptoms: dysuria and vaginal discharge (thick, off white, and mal odorous)   Associated symptoms: no chest pain, no chills, no diarrhea, no fatigue, no fever, no hematuria, no nausea, no shortness of breath, no vaginal bleeding and no vomiting     Past Medical History:  Diagnosis Date   Birthmarks, pigmented    right arm since birth   Fibrocystic breast    Hypercholesterolemia    hypercholesterolemia- diet [place]   Hypertension    Insomnia    Obesity    Obstructive sleep apnea    mild, no ask recommeded   Systolic murmur    innocent per pt., evaluateed years ago   Tobacco abuse    1/2 packper day    Patient Active Problem List   Diagnosis Date Noted   SLEEP DISORDER 09/27/2008   HYPERCHOLESTEROLEMIA 11/06/2006   OBESITY 11/06/2006   TOBACCO ABUSE 11/06/2006   HYPERTENSION 11/06/2006   FIBROCYSTIC BREAST DISEASE 11/06/2006   INSOMNIA 11/06/2006   SLEEP APNEA 11/06/2006   SYSTOLIC  MURMUR 86/57/8469    Past Surgical History:  Procedure Laterality Date   TUBAL LIGATION  1984    OB History   No obstetric history on file.      Home Medications    Prior to Admission medications   Medication Sig Start Date End Date Taking? Authorizing Provider  metroNIDAZOLE  (FLAGYL ) 500 MG tablet Take 1 tablet (500 mg total) by mouth 2 (two) times daily. 04/08/24  Yes Claybon Cuna, MD  atorvastatin (LIPITOR) 20 MG tablet Take 20 mg by mouth daily. 07/02/22   [provider]  losartan-hydrochlorothiazide  (HYZAAR) 100-25 MG tablet Take 1 tablet by mouth daily. 07/02/22 01/22/24  [provider]  metFORMIN (GLUCOPHAGE) 500 MG tablet Take 500 mg by mouth 2 (two) times daily with a meal. 02/10/23   [provider]    Family History Family History  Problem Relation Age of Onset   Breast cancer Neg Hx     Social History Social History   Tobacco Use   Smoking status: Some Days    Current packs/day: 0.50    Types: Cigarettes   Smokeless tobacco: Never  Vaping Use   Vaping status: Never Used  Substance Use Topics   Alcohol use: Yes   Drug use: Yes    Types: Cocaine     Allergies   Patient has no known allergies.   Review of Systems Review of Systems  Constitutional:  Negative for appetite  change, chills, fatigue and fever.  Respiratory:  Negative for chest tightness and shortness of breath.   Cardiovascular:  Negative for chest pain, palpitations and leg swelling.  Gastrointestinal:  Positive for abdominal pain. Negative for abdominal distention, blood in stool, diarrhea, nausea, rectal pain and vomiting.  Genitourinary:  Positive for dysuria and vaginal discharge (thick, off white, and mal odorous). Negative for difficulty urinating, flank pain, frequency, genital sores, hematuria, vaginal bleeding and vaginal pain (external itching but no lesions).  Musculoskeletal:  Negative for arthralgias, joint swelling and myalgias.  Skin:   Negative for rash.  Neurological:  Negative for dizziness, speech difficulty and light-headedness.  Hematological:  Negative for adenopathy.  Psychiatric/Behavioral:  Negative for confusion.      Physical Exam Triage Vital Signs ED Triage Vitals  Encounter Vitals Group     BP 04/08/24 0814 (!) 191/102     Systolic BP Percentile --      Diastolic BP Percentile --      Pulse Rate 04/08/24 0814 63     Resp 04/08/24 0814 16     Temp 04/08/24 0814 98.1 F (36.7 C)     Temp Source 04/08/24 0814 Oral     SpO2 04/08/24 0814 93 %     Weight --      Height --      Head Circumference --      Peak Flow --      Pain Score 04/08/24 0815 6     Pain Loc --      Pain Education --      Exclude from Growth Chart --    No data found.  Updated Vital Signs BP (!) 191/102 (BP Location: Left Arm)   Pulse 63   Temp 98.1 F (36.7 C) (Oral)   Resp 16   SpO2 93%   Visual Acuity Right Eye Distance:   Left Eye Distance:   Bilateral Distance:    Right Eye Near:   Left Eye Near:    Bilateral Near:     Physical Exam Vitals reviewed.  Constitutional:      General: She is not in acute distress.    Appearance: She is well-developed. She is not ill-appearing, toxic-appearing or diaphoretic.  Eyes:     Extraocular Movements: Extraocular movements intact.     Pupils: Pupils are equal, round, and reactive to light.  Cardiovascular:     Rate and Rhythm: Normal rate and regular rhythm.  Pulmonary:     Effort: Pulmonary effort is normal.  Abdominal:     General: Bowel sounds are normal. There is no distension. There are no signs of injury.     Palpations: Abdomen is soft. There is no shifting dullness, fluid wave, hepatomegaly, splenomegaly, mass or pulsatile mass.     Tenderness: There is no abdominal tenderness. There is no right CVA tenderness, left CVA tenderness, guarding or rebound. Negative signs include McBurney's sign.     Hernia: No hernia is present.  Skin:    General: Skin is warm.      Capillary Refill: Capillary refill takes 2 to 3 seconds.     Coloration: Skin is not cyanotic.     Findings: No erythema or rash.  Neurological:     General: No focal deficit present.     Mental Status: She is alert.  Psychiatric:        Mood and Affect: Mood normal.        Behavior: Behavior normal.      UC Treatments /  Results  Labs (all labs ordered are listed, but only abnormal results are displayed) Labs Reviewed  POCT URINALYSIS DIP (MANUAL ENTRY) - Abnormal; Notable for the following components:      Result Value   Clarity, UA cloudy (*)    Blood, UA small (*)    Leukocytes, UA Large (3+) (*)    All other components within normal limits  CERVICOVAGINAL ANCILLARY ONLY    EKG   Radiology No results found.  Procedures Procedures (including critical care time)  Medications Ordered in UC Medications - No data to display  Initial Impression / Assessment and Plan / UC Course  I have reviewed the triage vital signs and the nursing notes.  Pertinent labs & imaging results that were available during my care of the patient were reviewed by me and considered in my medical decision making (see chart for details).     Acute, recurrent vaginal discharge w/ dysuria - I discussed the patient's history and she does mainly douche for hygiene which I advised against as this may be causing recurrent infections.  She has had the same sexual partner for years and states he has been evaluated. - No red flag or systemic sxs - UA cloudy with leukocytes but nitrite negative. Likely 2/2 vaginal discharge - Previous swab in March positive for trichomonas, BV, and candida -Vaginal self swab and urinalysis pending.  Given history of symptoms and treatment we will start treatment for vaginal candidiasis and follow up with swab results.  - The patient voiced understanding and agreement with the plan.    Final Clinical Impressions(s) / UC Diagnoses   Final diagnoses:  Vaginal  discharge     Discharge Instructions      We will notify you of the swab results and if there is need to take further medications. I highly recommend avoiding douching for hygiene as this may be causing recurrent infections If you have fevers, chills, increasing abdominal pain, or worsening of your symptoms return. Make sure you take all the medication until it is gone.   ED Prescriptions     Medication Sig Dispense Auth. Provider   metroNIDAZOLE  (FLAGYL ) 500 MG tablet Take 1 tablet (500 mg total) by mouth 2 (two) times daily. 14 tablet Claybon Cuna, MD      PDMP not reviewed this encounter.   Claybon Cuna, MD 04/08/24 [id number]

## 2024-04-09 LAB — CERVICOVAGINAL ANCILLARY ONLY
Bacterial Vaginitis (gardnerella): POSITIVE — AB
Candida Glabrata: NEGATIVE
Candida Vaginitis: NEGATIVE
Chlamydia: NEGATIVE
Comment: NEGATIVE
Comment: NEGATIVE
Comment: NEGATIVE
Comment: NEGATIVE
Comment: NEGATIVE
Comment: NORMAL
Neisseria Gonorrhea: NEGATIVE
Trichomonas: POSITIVE — AB

## 2024-04-12 ENCOUNTER — Ambulatory Visit (HOSPITAL_COMMUNITY): Payer: Self-pay

## 2024-05-05 ENCOUNTER — Ambulatory Visit
Admission: RE | Admit: 2024-05-05 | Discharge: 2024-05-05 | Disposition: A | Discharge status: Home / Self Care | Source: Ambulatory Visit | Attending: Nurse Practitioner | Admitting: Nurse Practitioner

## 2024-05-05 DIAGNOSIS — B182 Chronic viral hepatitis C: Secondary | ICD-10-CM

## 2024-05-05 DIAGNOSIS — F1410 Cocaine abuse, uncomplicated: Secondary | ICD-10-CM

## 2024-05-05 DIAGNOSIS — F1010 Alcohol abuse, uncomplicated: Secondary | ICD-10-CM
